# Patient Record
Sex: Female | Born: 1935 | Hispanic: No | Marital: Single | Smoking: Former smoker
Health system: Southern US, Community
[De-identification: ages and names within clinical notes are randomized; demographics above are authoritative.]

## PROBLEM LIST (undated history)

## (undated) DIAGNOSIS — C189 Malignant neoplasm of colon, unspecified: Secondary | ICD-10-CM

## (undated) DIAGNOSIS — IMO0001 Reserved for inherently not codable concepts without codable children: Secondary | ICD-10-CM

## (undated) DIAGNOSIS — I10 Essential (primary) hypertension: Secondary | ICD-10-CM

## (undated) DIAGNOSIS — C801 Malignant (primary) neoplasm, unspecified: Secondary | ICD-10-CM

## (undated) HISTORY — PX: COLON SURGERY: SHX602

## (undated) HISTORY — PX: ABDOMINAL SURGERY: SHX537

---

## 2014-12-20 ENCOUNTER — Emergency Department (HOSPITAL_COMMUNITY): Payer: Self-pay

## 2014-12-20 ENCOUNTER — Encounter (HOSPITAL_COMMUNITY): Payer: Self-pay | Admitting: Emergency Medicine

## 2014-12-20 ENCOUNTER — Inpatient Hospital Stay (HOSPITAL_COMMUNITY)
Admission: EM | Admit: 2014-12-20 | Discharge: 2014-12-22 | DRG: 193 | Disposition: A | Discharge status: Home / Self Care | Payer: Self-pay | Attending: Internal Medicine | Admitting: Internal Medicine

## 2014-12-20 DIAGNOSIS — I10 Essential (primary) hypertension: Secondary | ICD-10-CM | POA: Diagnosis present

## 2014-12-20 DIAGNOSIS — R1084 Generalized abdominal pain: Secondary | ICD-10-CM

## 2014-12-20 DIAGNOSIS — R0902 Hypoxemia: Secondary | ICD-10-CM

## 2014-12-20 DIAGNOSIS — Z79899 Other long term (current) drug therapy: Secondary | ICD-10-CM

## 2014-12-20 DIAGNOSIS — D509 Iron deficiency anemia, unspecified: Secondary | ICD-10-CM | POA: Diagnosis present

## 2014-12-20 DIAGNOSIS — C787 Secondary malignant neoplasm of liver and intrahepatic bile duct: Secondary | ICD-10-CM | POA: Diagnosis present

## 2014-12-20 DIAGNOSIS — J9601 Acute respiratory failure with hypoxia: Secondary | ICD-10-CM | POA: Diagnosis present

## 2014-12-20 DIAGNOSIS — R319 Hematuria, unspecified: Secondary | ICD-10-CM | POA: Diagnosis present

## 2014-12-20 DIAGNOSIS — J181 Lobar pneumonia, unspecified organism: Principal | ICD-10-CM | POA: Diagnosis present

## 2014-12-20 DIAGNOSIS — C801 Malignant (primary) neoplasm, unspecified: Secondary | ICD-10-CM

## 2014-12-20 DIAGNOSIS — C189 Malignant neoplasm of colon, unspecified: Secondary | ICD-10-CM | POA: Diagnosis present

## 2014-12-20 DIAGNOSIS — Z9981 Dependence on supplemental oxygen: Secondary | ICD-10-CM

## 2014-12-20 DIAGNOSIS — R109 Unspecified abdominal pain: Secondary | ICD-10-CM | POA: Diagnosis present

## 2014-12-20 DIAGNOSIS — D638 Anemia in other chronic diseases classified elsewhere: Secondary | ICD-10-CM | POA: Diagnosis present

## 2014-12-20 HISTORY — DX: Reserved for inherently not codable concepts without codable children: IMO0001

## 2014-12-20 HISTORY — DX: Malignant (primary) neoplasm, unspecified: C80.1

## 2014-12-20 HISTORY — DX: Essential (primary) hypertension: I10

## 2014-12-20 LAB — COMPREHENSIVE METABOLIC PANEL
ALBUMIN: 3.9 g/dL (ref 3.5–5.0)
ALK PHOS: 81 U/L (ref 38–126)
ALT: 28 U/L (ref 14–54)
ANION GAP: 6 (ref 5–15)
AST: 31 U/L (ref 15–41)
BUN: 23 mg/dL — ABNORMAL HIGH (ref 6–20)
CO2: 31 mmol/L (ref 22–32)
Calcium: 9.4 mg/dL (ref 8.9–10.3)
Chloride: 103 mmol/L (ref 101–111)
Creatinine, Ser: 0.41 mg/dL — ABNORMAL LOW (ref 0.44–1.00)
GFR calc Af Amer: >60 mL/min (ref >60)
GFR calc non Af Amer: >60 mL/min (ref >60)
GLUCOSE: 108 mg/dL — AB (ref 65–99)
POTASSIUM: 3.7 mmol/L (ref 3.5–5.1)
SODIUM: 140 mmol/L (ref 135–145)
Total Bilirubin: 0.6 mg/dL (ref 0.3–1.2)
Total Protein: 8.2 g/dL — ABNORMAL HIGH (ref 6.5–8.1)

## 2014-12-20 LAB — CBC WITH DIFFERENTIAL/PLATELET
BASOS ABS: 0 10*3/uL (ref 0.0–0.1)
BASOS PCT: 0 %
EOS ABS: 0.1 10*3/uL (ref 0.0–0.7)
Eosinophils Relative: 2 %
HCT: 49.5 % — ABNORMAL HIGH (ref 36.0–46.0)
HEMOGLOBIN: 16 g/dL — AB (ref 12.0–15.0)
Lymphocytes Relative: 19 %
Lymphs Abs: 1.5 10*3/uL (ref 0.7–4.0)
MCH: 27.3 pg (ref 26.0–34.0)
MCHC: 32.3 g/dL (ref 30.0–36.0)
MCV: 84.5 fL (ref 78.0–100.0)
MONOS PCT: 10 %
Monocytes Absolute: 0.8 10*3/uL (ref 0.1–1.0)
NEUTROS PCT: 69 %
Neutro Abs: 5.3 10*3/uL (ref 1.7–7.7)
Platelets: 327 10*3/uL (ref 150–400)
RBC: 5.86 MIL/uL — ABNORMAL HIGH (ref 3.87–5.11)
RDW: 15.3 % (ref 11.5–15.5)
WBC: 7.7 10*3/uL (ref 4.0–10.5)

## 2014-12-20 LAB — URINALYSIS, ROUTINE W REFLEX MICROSCOPIC
Bilirubin Urine: NEGATIVE
Glucose, UA: NEGATIVE mg/dL
Ketones, ur: NEGATIVE mg/dL
LEUKOCYTES UA: NEGATIVE
NITRITE: NEGATIVE
PH: 5.5 (ref 5.0–8.0)
Protein, ur: 100 mg/dL — AB
SPECIFIC GRAVITY, URINE: 1.015 (ref 1.005–1.030)

## 2014-12-20 LAB — LIPASE, BLOOD: Lipase: 29 U/L (ref 11–51)

## 2014-12-20 LAB — PROTIME-INR
INR: 1.01 (ref 0.00–1.49)
Prothrombin Time: 13.5 seconds (ref 11.6–15.2)

## 2014-12-20 LAB — I-STAT TROPONIN, ED: TROPONIN I, POC: 0.01 ng/mL (ref 0.00–0.08)

## 2014-12-20 LAB — APTT: APTT: 35 s (ref 24–37)

## 2014-12-20 LAB — URINE MICROSCOPIC-ADD ON

## 2014-12-20 LAB — BRAIN NATRIURETIC PEPTIDE: B Natriuretic Peptide: 30.2 pg/mL (ref 0.0–100.0)

## 2014-12-20 LAB — STREP PNEUMONIAE URINARY ANTIGEN: Strep Pneumo Urinary Antigen: NEGATIVE

## 2014-12-20 LAB — I-STAT CG4 LACTIC ACID, ED: Lactic Acid, Venous: 1.28 mmol/L (ref 0.5–2.0)

## 2014-12-20 MED ORDER — INFLUENZA VAC SPLIT QUAD 0.5 ML IM SUSY
0.5000 mL | PREFILLED_SYRINGE | INTRAMUSCULAR | Status: AC
  Administered 2014-12-22: 0.5 mL via INTRAMUSCULAR
  Filled 2014-12-20 (×2): qty 0.5

## 2014-12-20 MED ORDER — ADULT MULTIVITAMIN W/MINERALS CH
1.0000 | ORAL_TABLET | Freq: Every day | ORAL | Status: DC
  Administered 2014-12-20 – 2014-12-22 (×3): 1 via ORAL
  Filled 2014-12-20 (×3): qty 1

## 2014-12-20 MED ORDER — CETYLPYRIDINIUM CHLORIDE 0.05 % MT LIQD: 7.0000 mL | Freq: Two times a day (BID) | OROMUCOSAL | Status: DC

## 2014-12-20 MED ORDER — AMLODIPINE BESYLATE 5 MG PO TABS
5.0000 mg | ORAL_TABLET | Freq: Every day | ORAL | Status: DC
  Administered 2014-12-20 – 2014-12-22 (×3): 5 mg via ORAL
  Filled 2014-12-20 (×4): qty 1

## 2014-12-20 MED ORDER — FERROUS SULFATE 325 (65 FE) MG PO TABS
325.0000 mg | ORAL_TABLET | Freq: Every day | ORAL | Status: DC
  Administered 2014-12-20 – 2014-12-22 (×3): 325 mg via ORAL
  Filled 2014-12-20 (×4): qty 1

## 2014-12-20 MED ORDER — IPRATROPIUM-ALBUTEROL 0.5-2.5 (3) MG/3ML IN SOLN
3.0000 mL | Freq: Three times a day (TID) | RESPIRATORY_TRACT | Status: DC
  Administered 2014-12-21 (×3): 3 mL via RESPIRATORY_TRACT
  Filled 2014-12-20 (×3): qty 3

## 2014-12-20 MED ORDER — HYDRALAZINE HCL 20 MG/ML IJ SOLN
5.0000 mg | Freq: Four times a day (QID) | INTRAMUSCULAR | Status: DC | PRN
  Administered 2014-12-20 – 2014-12-21 (×2): 5 mg via INTRAVENOUS
  Filled 2014-12-20 (×2): qty 1

## 2014-12-20 MED ORDER — IPRATROPIUM-ALBUTEROL 0.5-2.5 (3) MG/3ML IN SOLN
3.0000 mL | Freq: Four times a day (QID) | RESPIRATORY_TRACT | Status: DC
  Administered 2014-12-20: 3 mL via RESPIRATORY_TRACT
  Filled 2014-12-20: qty 3

## 2014-12-20 MED ORDER — CHLORHEXIDINE GLUCONATE 0.12 % MT SOLN
15.0000 mL | Freq: Two times a day (BID) | OROMUCOSAL | Status: DC
  Administered 2014-12-22: 15 mL via OROMUCOSAL
  Filled 2014-12-20 (×2): qty 15

## 2014-12-20 MED ORDER — IOHEXOL 300 MG/ML  SOLN
100.0000 mL | Freq: Once | INTRAMUSCULAR | Status: AC | PRN
Start: 2014-12-20 — End: 2014-12-20
  Administered 2014-12-20: 100 mL via INTRAVENOUS

## 2014-12-20 MED ORDER — METHYLPREDNISOLONE SODIUM SUCC 125 MG IJ SOLR
60.0000 mg | INTRAMUSCULAR | Status: DC
  Administered 2014-12-20: 60 mg via INTRAVENOUS
  Filled 2014-12-20: qty 2

## 2014-12-20 MED ORDER — DEXTROSE 5 % IV SOLN
500.0000 mg | INTRAVENOUS | Status: DC
  Administered 2014-12-21: 500 mg via INTRAVENOUS
  Filled 2014-12-20: qty 500

## 2014-12-20 MED ORDER — DEXTROSE 5 % IV SOLN
1.0000 g | Freq: Once | INTRAVENOUS | Status: AC
  Administered 2014-12-20: 1 g via INTRAVENOUS
  Filled 2014-12-20: qty 10

## 2014-12-20 MED ORDER — IPRATROPIUM-ALBUTEROL 0.5-2.5 (3) MG/3ML IN SOLN
3.0000 mL | RESPIRATORY_TRACT | Status: DC | PRN
Start: 2014-12-20 — End: 2014-12-22

## 2014-12-20 MED ORDER — DEXTROSE 5 % IV SOLN
500.0000 mg | Freq: Once | INTRAVENOUS | Status: AC
  Administered 2014-12-20: 500 mg via INTRAVENOUS
  Filled 2014-12-20: qty 500

## 2014-12-20 MED ORDER — DEXTROSE 5 % IV SOLN
1.0000 g | INTRAVENOUS | Status: DC
  Administered 2014-12-21: 1 g via INTRAVENOUS
  Filled 2014-12-20 (×2): qty 10

## 2014-12-20 MED ORDER — PNEUMOCOCCAL VAC POLYVALENT 25 MCG/0.5ML IJ INJ
0.5000 mL | INJECTION | INTRAMUSCULAR | Status: AC
  Administered 2014-12-22: 0.5 mL via INTRAMUSCULAR
  Filled 2014-12-20 (×2): qty 0.5

## 2014-12-20 MED ORDER — OXYCODONE-ACETAMINOPHEN 5-325 MG PO TABS
1.0000 | ORAL_TABLET | ORAL | Status: DC | PRN
  Administered 2014-12-21 – 2014-12-22 (×2): 2 via ORAL
  Filled 2014-12-20 (×2): qty 2

## 2014-12-20 MED ORDER — ACETAMINOPHEN 500 MG PO TABS: 500.0000 mg | ORAL_TABLET | Freq: Four times a day (QID) | ORAL | Status: DC | PRN

## 2014-12-20 NOTE — ED Notes (Addendum)
Pt is currently being moved to [facility] [employer]

## 2014-12-20 NOTE — ED Provider Notes (Signed)
CSN: [id number]     Arrival date & time 12/20/14  0944 History   First MD Initiated Contact with Patient 12/20/14 1004     Chief Complaint  Patient presents with  . Hematuria  . Abdominal Pain     (Consider location/radiation/quality/duration/timing/severity/associated sxs/prior Treatment) Patient is a 81 y.o. female presenting with abdominal pain. The history is provided by a relative. The history is limited by a language barrier.  Abdominal Pain Pain location:  Suprapubic Pain quality: aching   Pain radiates to:  Does not radiate Pain severity:  Moderate Onset quality:  Gradual Duration:  2 days Timing:  Constant Progression:  Unchanged Chronicity:  New Context: previous surgery   Context: not alcohol use and not trauma   Relieved by:  Nothing Worsened by:  Nothing tried Ineffective treatments:  None tried Associated symptoms: hematuria and shortness of breath (with exertion)   Associated symptoms: no chest pain, no cough, no fever and no vomiting   Risk factors: being elderly and obesity     Past Medical History  Diagnosis Date  . Cancer [facility])     stomach   Past Surgical History  Procedure Laterality Date  . Abdominal surgery     No family history on file. Social History  Substance Use Topics  . Smoking status: Never Smoker   . Smokeless tobacco: None  . Alcohol Use: No   OB History    No data available     Review of Systems  Constitutional: Negative for fever.  Respiratory: Positive for shortness of breath (with exertion). Negative for cough.   Cardiovascular: Negative for chest pain.  Gastrointestinal: Positive for abdominal pain. Negative for vomiting.  Genitourinary: Positive for hematuria.  All other systems reviewed and are negative.     Allergies  Review of patient's allergies indicates no known allergies.  Home Medications   Prior to Admission medications   Medication Sig Start Date End Date Taking? Authorizing Provider  acetaminophen  (TYLENOL) 500 MG tablet Take 500 mg by mouth every 6 (six) hours as needed for mild pain.   Yes Historical Provider, MD  amLODipine (NORVASC) 5 MG tablet Take 5 mg by mouth daily.   Yes Historical Provider, MD  ferrous sulfate 325 (65 FE) MG EC tablet Take 325 mg by mouth daily.   Yes Historical Provider, MD  Multiple Vitamin (MULTIVITAMIN WITH MINERALS) TABS tablet Take 1 tablet by mouth daily.   Yes Historical Provider, MD  oxyCODONE-acetaminophen (PERCOCET/ROXICET) 5-325 MG tablet Take 1-5 tablets by mouth every 4 (four) hours as needed for severe pain.   Yes Historical Provider, MD   BP 164/75 mmHg  Pulse 57  Temp(Src) 97.9 F (36.6 C) (Oral)  Resp 18  Wt 153 lb 8 oz (69.627 kg)  SpO2 98% Physical Exam  Constitutional: She is oriented to person, place, and time. She appears well-developed and well-nourished. No distress.  HENT:  Head: Normocephalic.  Eyes: Conjunctivae are normal.  Neck: Neck supple. No tracheal deviation present.  Cardiovascular: Normal rate, regular rhythm and normal heart sounds.   No murmur heard. Pulmonary/Chest: Effort normal. No respiratory distress. She has no wheezes. She has rales (bilaterally). She exhibits no tenderness.  Abdominal: Soft. She exhibits no distension. There is tenderness (mild lower abdomen). There is no rebound and no guarding.  Neurological: She is alert and oriented to person, place, and time.  Skin: Skin is warm and dry.  Psychiatric: She has a normal mood and affect.  Vitals reviewed.   ED Course  Procedures (including critical care time)  Emergency Focused Ultrasound Exam Limited Ultrasound of the Heart and Pericardium  Performed and interpreted by Dr. Laneta Simmers Indication: dyspnea Multiple views of the heart, pericardium, and IVC are obtained with a multi frequency probe.  Findings: nml contractility, no anechoic fluid, minimal IVC collapse Interpretation: nml ejection fraction, no pericardial effusion, no depressed CVP Images  archived electronically.  CPT Code: [id number]   Labs Review Labs Reviewed  CBC WITH DIFFERENTIAL/PLATELET - Abnormal; Notable for the following:    RBC 5.86 (*)    Hemoglobin 16.0 (*)    HCT 49.5 (*)    All other components within normal limits  COMPREHENSIVE METABOLIC PANEL - Abnormal; Notable for the following:    Glucose, Bld 108 (*)    BUN 23 (*)    Creatinine, Ser 0.41 (*)    Total Protein 8.2 (*)    All other components within normal limits  URINALYSIS, ROUTINE W REFLEX MICROSCOPIC (NOT AT [facility]) - Abnormal; Notable for the following:    APPearance CLOUDY (*)    Hgb urine dipstick SMALL (*)    Protein, ur 100 (*)    All other components within normal limits  URINE MICROSCOPIC-ADD ON - Abnormal; Notable for the following:    Squamous Epithelial / LPF 0-5 (*)    Bacteria, UA MANY (*)    All other components within normal limits  URINE CULTURE  CULTURE, BLOOD (ROUTINE X 2)  CULTURE, BLOOD (ROUTINE X 2)  LIPASE, BLOOD  PROTIME-INR  APTT  BRAIN NATRIURETIC PEPTIDE  I-STAT CG4 LACTIC ACID, ED  I-STAT TROPOININ, ED    Imaging Review Ct Abdomen Pelvis W Contrast  12/20/2014  CLINICAL DATA:  Hematuria and abdominal pain. History of gastric cancer. EXAM: CT ABDOMEN AND PELVIS WITH CONTRAST TECHNIQUE: Multidetector CT imaging of the abdomen and pelvis was performed using the standard protocol following bolus administration of intravenous contrast. CONTRAST:  198mL OMNIPAQUE IOHEXOL 300 MG/ML  SOLN COMPARISON:  None. FINDINGS: Lower chest: Motion degradation throughout. Bibasilar atelectasis. Mild cardiomegaly, without pericardial or pleural effusion. Hepatobiliary: Suspect mild hepatic steatosis. subcapsular right hepatic lobe hypo attenuating lesion measures 4.4 x 2.1 cm on image 20/series 2. Normal gallbladder.  Common duct normal for age, 7 mm. Pancreas: Normal for age, without duct dilatation or mass. Spleen: Normal in size, without focal abnormality. Adrenals/Urinary Tract: Normal  adrenal glands. Too small to characterize left renal lesion. Normal right kidney. Suspect a left renal calculus on coronal image 72/series 5. No hydronephrosis. Normal urinary bladder. Stomach/Bowel: No dominant gastric mass. Right hemicolectomy with ileocolic anastomosis. Normal small bowel. Vascular/Lymphatic: Aortic and branch vessel atherosclerosis. No abdominopelvic adenopathy. Reproductive: Dystrophic calcifications within the uterus. No adnexal mass. Other: No significant free fluid. No evidence of omental or peritoneal disease. Musculoskeletal: Mild osteopenia. Suspect remote right inferior pubic ramus trauma. Mild superior endplate compression deformity at L1. IMPRESSION: 1. Mild motion degradation throughout. 2. Suspicion of left nephrolithiasis. Suboptimally evaluated secondary to nondedicated hematuria technique. No obstructive uropathy. 3. Low-density right hepatic lobe subcapsular lesion is indeterminate. Relatively well-circumscribed, without definite enhancement. Given the clinical history of primary malignancy, metastatic disease cannot be excluded. In addition, treated metastatic disease (i.e. post ablation) could have this appearance. Correlation with prior imaging and clinical history suggested. If no prior imaging or clinical history available, tissue sampling may be necessary. MRI may be informative but could be challenging in this 81 year old patient. 4. No dominant gastric mass identified, despite the clinical history. Status post right hemicolectomy. Electronically Signed  By: Abigail Miyamoto M.D.   On: 12/20/2014 13:18   Dg Chest Port 1 View  12/20/2014  CLINICAL DATA:  81 year old female with hypoxia and shortness of breath. EXAM: PORTABLE CHEST 1 VIEW COMPARISON:  None. FINDINGS: Cardiomegaly noted. Mild bilateral airspace opacities are identified. There is no evidence of pleural effusion or pneumothorax. No acute bony abnormalities are identified. IMPRESSION: Bilateral airspace  opacities which may represent pulmonary edema versus infection/pneumonia. Cardiomegaly. Electronically Signed   By: Margarette Canada M.D.   On: 12/20/2014 14:45   I have personally reviewed and evaluated these images and lab results as part of my medical decision-making.   EKG Interpretation None      MDM   Final diagnoses:  Hypoxemia    81 year old female with significant past medical history of previous intra-abdominal cancer presents with lower abdominal pain that has been ongoing associated with small volume hematuria. On arrival she is hypoxemic into the 80s while at rest but is not acutely dyspneic. Family member at bedside provides most of the history and patient is difficult historian due to language barrier. Patient also complaining of some dysuria, urine is questionable for possible infection but no large blood, CT scan of the abdomen and pelvis shows no acute abnormalities. Patient is unable to wean from oxygen and chest x-ray showing infiltrates concerning for pneumonia versus pulmonary edema and given her clinical symptoms I suspect pulmonary edema is more likely. No effusion or severely depressed ejection fraction on bedside echo. Discussed with hospitalist who recommended admission and antibiotics for community acquired pneumonia coverage and will see the patient in the emergency department prior to admission.   Leo Grosser, MD 12/20/14 [id number]

## 2014-12-20 NOTE — H&P (Signed)
[facility] History and Physical  Leonna Caird [id number] DOB: [birth year] DOA: 12/20/2014  Referring physician: ER physician: Dr. Leo Grosser  PCP: No primary care provider on file. we will set up with [facility] on discharge   Chief Complaint: abdominal pain  HPI:  81 year old female (speaks [place]), no family at the bedside to give details of medical history and pt cant speak English so limited history based on EPIC chart review. Apparently she had abdominal pain for past 2 days prior to this admission mostly in suprapubic area with blood  In urine as reported earlier today by her granddaughter. Pt had no reports of nausea or vomiting. No shortness of breath, no chest pain, no palpitations. No diarrhea or constipation. No blood in stool or urine. No lightheadedness or loss of consciousness.   In ED, pt was hemodynamically stable but was hypoxemic with O 2 sats of 85% on room air. This has improved with [place] oxygen support. Blood work showed Hgb of 16 otherwise unremarkable. CXR showed bilateral airspace opacities which may represent pulmonary edema versus infection/pneumonia. She was started on azithromycin and rocephin.    Assessment & Plan    Principal Problem:   Acute respiratory failure with hypoxia (HCC) / Lobar pneumonia, unspecified organism ([place]) - CXR with findings of possible pneumonia versus pulmonary edema - Would treat empirically for pneumonia with azithromycin and rocephin but stop abx if clinically stable  - Pneumonia order set placed on admission - Follow up blood cultures, resp culture, legionella, strep penumonia - Because of wheezing will start solumderol once a day regimen 60 mg IV - Start duoneb scheduled and as needed for shortness of breath or wheezing  - Oxygen support via [place] to keep O2 sat above 90%  Active Problems:   Abdominal pain / Hematuria - Unclear etiology - Suspicion for liver malignancy based on CT scan, left nephrolithiasis but no  obstructive uropathy - Order placed for MRI abdomen - On UA no RBC    Essential hypertension - Continue Norvasc 5 mg daily    IDA (iron deficiency anemia) - Continue ferrous sulfate supplementation   DVT prophylaxis:  - SCD's bilaterally   Radiological Exams on Admission: Ct Abdomen Pelvis W Contrast 12/20/2014  1. Mild motion degradation throughout. 2. Suspicion of left nephrolithiasis. Suboptimally evaluated secondary to nondedicated hematuria technique. No obstructive uropathy. 3. Low-density right hepatic lobe subcapsular lesion is indeterminate. Relatively well-circumscribed, without definite enhancement. Given the clinical history of primary malignancy, metastatic disease cannot be excluded. In addition, treated metastatic disease (i.e. post ablation) could have this appearance.   Dg Chest Port 1 View 12/20/2014  Bilateral airspace opacities which may represent pulmonary edema versus infection/pneumonia. Cardiomegaly. Electronically Signed   By: Margarette Canada M.D.   On: 12/20/2014 14:45    Code Status: Full Family Communication: Plan of care discussed with the patient  Disposition Plan: Admit for further evaluation, telemetry   Leisa Lenz, MD  [facility] Pager [phone]  Time spent in minutes: 55 minutes  Review of Systems:  Constitutional: Negative for fever, chills and malaise/fatigue. Negative for diaphoresis.  HENT: Negative for hearing loss, ear pain, nosebleeds, congestion, sore throat, neck pain, tinnitus and ear discharge.   Eyes: Negative for blurred vision, double vision, photophobia, pain, discharge and redness.  Respiratory: Negative for cough, hemoptysis, sputum production, shortness of breath, wheezing and stridor.   Cardiovascular: Negative for chest pain, palpitations, orthopnea, claudication and leg swelling.  Gastrointestinal: Negative for nausea, vomiting. Negative for heartburn, constipation, blood  in stool and melena.  Genitourinary: Negative for  dysuria, urgency, frequency.  Musculoskeletal: Negative for myalgias, back pain, joint pain and falls.  Skin: Negative for itching and rash.  Neurological: Negative for dizziness and weakness. Negative for tingling, tremors, sensory change, speech change, focal weakness, loss of consciousness and headaches.  Endo/Heme/Allergies: Negative for environmental allergies and polydipsia. Does not bruise/bleed easily.  Psychiatric/Behavioral: Negative for suicidal ideas. The patient is not nervous/anxious.      Past Medical History  Diagnosis Date  . Cancer [facility])     stomach   Past Surgical History  Procedure Laterality Date  . Abdominal surgery     Social History:  reports that she has never smoked. She does not have any smokeless tobacco history on file. She reports that she does not drink alcohol. Her drug history is not on file.  No Known Allergies  Family History: hypertension in family    Prior to Admission medications   Medication Sig Start Date End Date Taking? Authorizing Provider  acetaminophen (TYLENOL) 500 MG tablet Take 500 mg by mouth every 6 (six) hours as needed for mild pain.   Yes Historical Provider, MD  amLODipine (NORVASC) 5 MG tablet Take 5 mg by mouth daily.   Yes Historical Provider, MD  ferrous sulfate 325 (65 FE) MG EC tablet Take 325 mg by mouth daily.   Yes Historical Provider, MD  Multiple Vitamin (MULTIVITAMIN WITH MINERALS) TABS tablet Take 1 tablet by mouth daily.   Yes Historical Provider, MD  oxyCODONE-acetaminophen (PERCOCET/ROXICET) 5-325 MG tablet Take 1-5 tablets by mouth every 4 (four) hours as needed for severe pain.   Yes Historical Provider, MD   Physical Exam: Filed Vitals:   12/20/14 1201 12/20/14 1230 12/20/14 1330 12/20/14 1435  BP: 166/84 150/73 153/82 164/75  Pulse: 58 55 52 57  Temp:      TempSrc:      Resp: 16 17 16 18   Weight:      SpO2: 95% 97% 94% 98%    Physical Exam  Constitutional: Appears well-developed and well-nourished.  No distress.  HENT: Normocephalic. No tonsillar erythema or exudates Eyes: Conjunctivae are normal. No scleral icterus.  Neck: Normal ROM. Neck supple. No JVD. No tracheal deviation. No thyromegaly.  CVS: RRR, S1/S2 +, no murmurs, no gallops, no carotid bruit.  Pulmonary: moderate wheezing in upper and mid lung lobes. No rhonchi. Abdominal: Soft. BS +,  no distension, tenderness, rebound or guarding.  Musculoskeletal: Normal range of motion. No edema and no tenderness.  Lymphadenopathy: No lymphadenopathy noted, cervical, inguinal. Neuro: Alert. Normal reflexes, muscle tone coordination. No focal neurologic deficits. Skin: Skin is warm and dry. No rash noted.  No erythema. No pallor.  Psychiatric: Normal mood and affect. Behavior, judgment, thought content normal.   Labs on Admission:  Basic Metabolic Panel:  Recent Labs Lab 12/20/14 1021  NA 140  K 3.7  CL 103  CO2 31  GLUCOSE 108*  BUN 23*  CREATININE 0.41*  CALCIUM 9.4   Liver Function Tests:  Recent Labs Lab 12/20/14 1021  AST 31  ALT 28  ALKPHOS 81  BILITOT 0.6  PROT 8.2*  ALBUMIN 3.9    Recent Labs Lab 12/20/14 1021  LIPASE 29   No results for input(s): AMMONIA in the last 168 hours. CBC:  Recent Labs Lab 12/20/14 1021  WBC 7.7  NEUTROABS 5.3  HGB 16.0*  HCT 49.5*  MCV 84.5  PLT 327   Cardiac Enzymes: No results for input(s): CKTOTAL, CKMB,  CKMBINDEX, TROPONINI in the last 168 hours. BNP: Invalid input(s): POCBNP CBG: No results for input(s): GLUCAP in the last 168 hours.  If 7PM-7AM, please contact night-coverage [email or web address] Password [facility] 12/20/2014, 4:12 PM

## 2014-12-20 NOTE — ED Notes (Signed)
Pt speaks [place].  Her Grand daughter is bedside.  Her son, Leda Min is in [place] and can be reached at [phone].  Son makes her healthcare decisions but there is no HCPOA paperwork done.  Pts son states that Pt is a FULL CODE.

## 2014-12-20 NOTE — ED Notes (Addendum)
[place] MD made aware of Pt's O2 sat w/o oxygen.

## 2014-12-20 NOTE — ED Notes (Signed)
Pt c/o blood in her urine that started on Saturday per granddaughter, who showed ED staff video of urine on her cell phone in triage.  Pt also c/o abd pain.  Pt has stomach cancer.

## 2014-12-20 NOTE — Progress Notes (Signed)
Pt unable to tell Cm using interpreter phone who her pcp was States she sees a female dr but did not know the pcp name States she came from the dr office today before coming to the ED Pt also states she does not have a pill bottle with name of pcp either Reports she was brought here with children vs going to [place] with other children  [place] daughter was here per ED RN but left to return to work and did not leave a phone number because she stated she could not be called at work  Cm left [place] interpreter on the phone line to assist with pt and ED RN

## 2014-12-20 NOTE — Progress Notes (Signed)
PHARMACY NOTE -  Renal dose adjust antibiotics  Pharmacy has been assisting with renal dosing of antibiotics for CAP. Dosages of Rocephin 1gm IV daily & Zithromax 500mg  IV daily are appropriate and need for further dosage adjustment appears unlikely at present.    Will sign off at this time.  Please reconsult if a change in clinical status warrants re-evaluation of dosage.  Thank you,  Netta Cedars, PharmD, BCPS Pager: [phone] 12/20/2014@5 :40 PM

## 2014-12-20 NOTE — ED Notes (Signed)
Pt was found to have O2 sat 83% RA.  Pt placed on 3L [place] and sat increased to 94%.

## 2014-12-21 LAB — COMPREHENSIVE METABOLIC PANEL
ALBUMIN: 3.5 g/dL (ref 3.5–5.0)
ALT: 27 U/L (ref 14–54)
ANION GAP: 6 (ref 5–15)
AST: 27 U/L (ref 15–41)
Alkaline Phosphatase: 77 U/L (ref 38–126)
BILIRUBIN TOTAL: 0.3 mg/dL (ref 0.3–1.2)
BUN: 28 mg/dL — AB (ref 6–20)
CHLORIDE: 102 mmol/L (ref 101–111)
CO2: 29 mmol/L (ref 22–32)
Calcium: 9.3 mg/dL (ref 8.9–10.3)
Creatinine, Ser: 0.59 mg/dL (ref 0.44–1.00)
GFR calc Af Amer: >60 mL/min (ref >60)
Glucose, Bld: 218 mg/dL — ABNORMAL HIGH (ref 65–99)
POTASSIUM: 3.8 mmol/L (ref 3.5–5.1)
Sodium: 137 mmol/L (ref 135–145)
TOTAL PROTEIN: 7.6 g/dL (ref 6.5–8.1)

## 2014-12-21 LAB — CBC
HEMATOCRIT: 48 % — AB (ref 36.0–46.0)
HEMOGLOBIN: 15.3 g/dL — AB (ref 12.0–15.0)
MCH: 26.7 pg (ref 26.0–34.0)
MCHC: 31.9 g/dL (ref 30.0–36.0)
MCV: 83.9 fL (ref 78.0–100.0)
Platelets: 352 10*3/uL (ref 150–400)
RBC: 5.72 MIL/uL — ABNORMAL HIGH (ref 3.87–5.11)
RDW: 15.4 % (ref 11.5–15.5)
WBC: 7 10*3/uL (ref 4.0–10.5)

## 2014-12-21 LAB — URINE CULTURE: Culture: NO GROWTH

## 2014-12-21 LAB — HIV ANTIBODY (ROUTINE TESTING W REFLEX): HIV SCREEN 4TH GENERATION: NONREACTIVE

## 2014-12-21 MED ORDER — METHYLPREDNISOLONE SODIUM SUCC 125 MG IJ SOLR
60.0000 mg | Freq: Every day | INTRAMUSCULAR | Status: DC
  Administered 2014-12-22: 60 mg via INTRAVENOUS
  Filled 2014-12-21: qty 2

## 2014-12-21 NOTE — Progress Notes (Signed)
Patient ID: Hayley Lewis, female   DOB: 1924-08-23, 81 y.o.   MRN: [id number] [facility] PROGRESS NOTE  Hayley Lewis [id number] DOB: 09-08-1924 DOA: 12/20/2014 PCP: No primary care provider on file. - will set up with [facility] on discharge   Brief narrative:    81 year old female (speaks [place]), no family at the bedside to give details of medical history and pt cant speak English so limited history since admission. Pt with history of cancer (unknown details of this cancer other than knowing from CT scan she has had hemicolectomy). Pt presented wiht abdominal pain for past 2 days prior to this admission mostly in suprapubic area with bloodin urine. No blood in stool. No fevers.   In ED, pt was hemodynamically stable but was hypoxemic with O2 sats of 85% on room air. This has improved with [place] oxygen support. Blood work showed Hgb of 16 otherwise unremarkable. CT abdomen showed suspicion of left nephrolithiasis but no obstructive uropathy. CXR showed bilateral airspace opacities, pulmonary edema versus infection/pneumonia. She was started on azithromycin and rocephin.   Anticipated discharge: Plan for MRI tomorrow after interpretor available. Anticipate discharge by 12/1 or 12/2 if results ok and no further work up needed.   Assessment/Plan:    Principal Problem:  Acute respiratory failure with hypoxia (HCC) / Lobar pneumonia, unspecified organism ([place]) - CXR with findings of possible pneumonia versus pulmonary edema - Because of wheezing on physical exam we started empiric treatment for pneumonia with azithromycin and rocephin - Started low dose once a day solumedrol. Still some wheezing this am so we will continue solumedrol for now.  - Blood cultures, urine culture, HIV so far all negative - Legionella, strep pneumonia - pending  - Continue nebulizer treatments as needed for shortness of breath or wheezing  - Oxygen support via [place] to keep O2 sat above 90%  Active  Problems:  Abdominal pain / Hematuria - Unclear etiology - Suspicion for liver malignancy based on CT scan, left nephrolithiasis but no obstructive uropathy - Hematuria resolved - Did not do MRI today since no interpretor but will be done tomorrow (interpretor found)   Essential hypertension - Continue Norvasc 5 mg daily   IDA (iron deficiency anemia) / Anemia of chronic disease  - Continue ferrous sulfate supplementation   DVT prophylaxis:  - SCD's bilaterally in hospital   Code Status: Full.  Family Communication:  Called son [name] over the phone for update [phone] Disposition Plan: Home 12/1 or 12/2 depending on MRI results and if any work up needed   IV access:  Peripheral IV  Procedures and diagnostic studies:    Ct Abdomen Pelvis W Contrast 12/20/2014 1. Mild motion degradation throughout. 2. Suspicion of left nephrolithiasis. Suboptimally evaluated secondary to nondedicated hematuria technique. No obstructive uropathy. 3. Low-density right hepatic lobe subcapsular lesion is indeterminate. Relatively well-circumscribed, without definite enhancement. Given the clinical history of primary malignancy, metastatic disease cannot be excluded. In addition, treated metastatic disease (i.e. post ablation) could have this appearance.   Dg Chest Port 1 View 12/20/2014 Bilateral airspace opacities which may represent pulmonary edema versus infection/pneumonia. Cardiomegaly. Electronically Signed By: Margarette Canada M.D. On: 12/20/2014 14:45   Medical Consultants:  None   Other Consultants:  None   IAnti-Infectives:   Azithromycin and rocephin 12/20/2014 -->    Leisa Lenz, MD  [facility] Pager [phone]  Time spent in minutes: 25 minutes  If 7PM-7AM, please contact night-coverage [email or web address] Password [email or web address] 12/21/2014, 5:45 PM   LOS: 1 day  HPI/Subjective: No acute overnight events. No further hematuria per RN report.   Objective: Filed Vitals:    12/21/14 0621 12/21/14 0819 12/21/14 0947 12/21/14 1501  BP: 136/72  146/81 130/76  Pulse:    74  Temp:    98 F (36.7 C)  TempSrc:    Oral  Resp:    20  Height:      Weight:      SpO2:  92%  97%   No intake or output data in the 24 hours ending 12/21/14 1745  Exam:   General:  Pt is alert, not in acute distress  Cardiovascular: Regular rate and rhythm, S1/S2 (+)  Respiratory: mild wheezing in upper lung lobes   Abdomen: Soft, non tender, non distended, bowel sounds present  Extremities: pulses DP and PT palpable bilaterally, no cyanosis   Neuro: Grossly nonfocal  Data Reviewed: Basic Metabolic Panel:  Recent Labs Lab 12/20/14 1021 12/21/14 0443  NA 140 137  K 3.7 3.8  CL 103 102  CO2 31 29  GLUCOSE 108* 218*  BUN 23* 28*  CREATININE 0.41* 0.59  CALCIUM 9.4 9.3   Liver Function Tests:  Recent Labs Lab 12/20/14 1021 12/21/14 0443  AST 31 27  ALT 28 27  ALKPHOS 81 77  BILITOT 0.6 0.3  PROT 8.2* 7.6  ALBUMIN 3.9 3.5    Recent Labs Lab 12/20/14 1021  LIPASE 29   No results for input(s): AMMONIA in the last 168 hours. CBC:  Recent Labs Lab 12/20/14 1021 12/21/14 0443  WBC 7.7 7.0  NEUTROABS 5.3  --   HGB 16.0* 15.3*  HCT 49.5* 48.0*  MCV 84.5 83.9  PLT 327 352   Cardiac Enzymes: No results for input(s): CKTOTAL, CKMB, CKMBINDEX, TROPONINI in the last 168 hours. BNP: Invalid input(s): POCBNP CBG: No results for input(s): GLUCAP in the last 168 hours.  Recent Results (from the past 240 hour(s))  Urine culture     Status: None   Collection Time: 12/20/14 11:10 AM  Result Value Ref Range Status   Specimen Description URINE, CATHETERIZED  Final   Special Requests NONE  Final   Culture   Final    NO GROWTH 1 DAY Performed at [facility]    Report Status 12/21/2014 FINAL  Final  Blood culture (routine x 2)     Status: None (Preliminary result)   Collection Time: 12/20/14  3:25 PM  Result Value Ref Range Status   Specimen  Description BLOOD LEFT ANTECUBITAL  Final   Special Requests BOTTLES DRAWN AEROBIC AND ANAEROBIC 5ML  Final   Culture   Final    NO GROWTH < 24 HOURS Performed at [facility]    Report Status PENDING  Incomplete  Blood culture (routine x 2)     Status: None (Preliminary result)   Collection Time: 12/20/14  3:40 PM  Result Value Ref Range Status   Specimen Description BLOOD RIGHT ANTECUBITAL  Final   Special Requests BOTTLES DRAWN AEROBIC AND ANAEROBIC 5ML  Final   Culture   Final    NO GROWTH < 24 HOURS Performed at [facility]    Report Status PENDING  Incomplete     Scheduled Meds: . amLODipine  5 mg Oral Daily  . antiseptic oral rinse  7 mL Mouth Rinse q12n4p  . azithromycin  500 mg Intravenous Q24H  . cefTRIAXone (ROCEPHIN)  IV  1 g Intravenous Q24H  . chlorhexidine  15 mL Mouth Rinse BID  . ferrous  sulfate  325 mg Oral Daily  . Influenza vac split quadrivalent PF  0.5 mL Intramuscular Tomorrow-1000  . ipratropium-albuterol  3 mL Nebulization TID  . methylPREDNISolone (SOLU-MEDROL) injection  60 mg Intravenous Q24H  . multivitamin with minerals  1 tablet Oral Daily  . pneumococcal 23 valent vaccine  0.5 mL Intramuscular Tomorrow-1000   Continuous Infusions:

## 2014-12-21 NOTE — Progress Notes (Signed)
  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  Pt had recent diagnosis of colon cancer in 04/2014. She had liver lesion even then and it was biopsied and showed high grade adenocarcinoma, colon ca primary. She underwent microablation of liver lesion and was scheduled to have repeat CT or MRI to follow up on this and see if any improvement. We will go ahead and proceed with MRI tomorrow so we can see if any progress after microablation.  Leisa Lenz [facility] [id number]

## 2014-12-22 DIAGNOSIS — J181 Lobar pneumonia, unspecified organism: Principal | ICD-10-CM

## 2014-12-22 DIAGNOSIS — C189 Malignant neoplasm of colon, unspecified: Secondary | ICD-10-CM | POA: Diagnosis present

## 2014-12-22 DIAGNOSIS — I10 Essential (primary) hypertension: Secondary | ICD-10-CM

## 2014-12-22 DIAGNOSIS — J9601 Acute respiratory failure with hypoxia: Secondary | ICD-10-CM

## 2014-12-22 DIAGNOSIS — C787 Secondary malignant neoplasm of liver and intrahepatic bile duct: Secondary | ICD-10-CM

## 2014-12-22 DIAGNOSIS — R1030 Lower abdominal pain, unspecified: Secondary | ICD-10-CM

## 2014-12-22 DIAGNOSIS — R319 Hematuria, unspecified: Secondary | ICD-10-CM

## 2014-12-22 LAB — LEGIONELLA PNEUMOPHILA SEROGP 1 UR AG: L. PNEUMOPHILA SEROGP 1 UR AG: NEGATIVE

## 2014-12-22 MED ORDER — LEVOFLOXACIN 750 MG PO TABS: 750.0000 mg | ORAL_TABLET | Freq: Every day | ORAL | Status: AC

## 2014-12-22 MED ORDER — PREDNISONE 50 MG PO TABS: ORAL_TABLET | ORAL | Status: AC

## 2014-12-22 NOTE — Progress Notes (Signed)
SATURATION QUALIFICATIONS: (This note is used to comply with regulatory documentation for home oxygen)  Patient Saturations on Room Air at Rest = 95%  Patient Saturations on Room Air while Ambulating = 86%  Patient Saturations on 2 Liters of oxygen while Ambulating = 95%  Please briefly explain why patient needs home oxygen: Pt desat when she walks

## 2014-12-22 NOTE — Progress Notes (Signed)
Discharge instructions were given to patient son. [facility] delivered oxygen. Pt will be D/C home with family in stable condition.

## 2014-12-22 NOTE — Discharge Summary (Signed)
Physician Discharge Summary  Hayley Lewis [id number] DOB: 1924/07/23 DOA: 12/20/2014  PCP: No primary care provider on file.  Admit date: 12/20/2014 Discharge date: 12/22/2014  Time spent:35 minutes  Recommendations for Outpatient Follow-up:  1. Discharge home with outpatient follow-up with her surgeon at [place](  Dr Remigio Eisenmenger) . As per family she has an appointment for MRI of her abdomen on 12/6. Patient will be discharged on continuous home O2 (2 L via nasal cannula) 2. Patient incomplete total 7 days of antibiotic (Levaquin) on 12/5 3.    Discharge Diagnoses:  Principal Problem:   Acute respiratory failure with hypoxia (HCC)   Active Problems:   Lobar pneumonia, unspecified organism ([place])   Abdominal pain   Essential hypertension   IDA (iron deficiency anemia)   Hematuria   Adenocarcinoma of colon [facility])   Discharge Condition: Fair  Diet recommendation: Regular  Filed Weights   12/20/14 0956 12/20/14 1744  Weight: 69.627 kg (153 lb 8 oz) 69.627 kg (153 lb 8 oz)    History of present illness:  Reason for to admission H&P for details, in brief, 81 year old female (who speaks [place] only) with history of adenocarcinoma of the colon with liver metastases (diagnosed earlier this year and underwent accelerated a laparotomy, ileocolectomy and a small bowel resection in April this year , followed by [facility] of the liver mass on 11/4 seen on biopsy and PET scan), follows at [facility] with Dr. Heather Roberts was brought to the ED with abdominal pain for 2 days duration and some blood in urine. Patient did not have any fevers or chills or any blood in stool. She did not have any nausea or vomiting, chest pain, palpitations complained of shortness of breath. In the ED patient was found to be hypoxic with O2 sat of 85% on room air and improved on nasal cannula. Blood work was unremarkable. Chest x-ray done showed bilateral airspace opacities with pulmonary edema versus  infection. CT of her abdomen showed suspicion of an obstructed left nephrolithiasis and liver mass. Patient admitted to hospital service for possible community acquired pneumonia.  This morning patient's grandson was at bedside who interpreted for the patient.  Hospital Course:  Acute respiratory failure with hypoxia secondary to lobar pneumonia Patient just on empiric Rocephin and azithromycin. She had wheezing on exam and started on Solu-Medrol and as needed nebs. -Blood cultures, urine cultures have been negative. HIV antibody negative. Strep urinary antigen negative. Legionella antigen is pending. Patient remains afebrile and her symptoms have improved. She will be discharged on oral Levaquin to complete a seven-day course of antibiotics. Patient does require continuous home oxygen as she desaturates to 80% on room air and improved to >92% on 2 L via nasal cannula. -I will also discharge her on a short course of oral prednisone (5 days)  Abdominal pain with hematuria No clear etiology. CT scan does show liver lesions but she does have a diagnosis of metastatic adenocarcinoma of the colon with liver metastases. Symptoms have resolved at this time. UA also showing UTI but cultures negative. Will be treated with empiric Levaquin. Patient will follow-up with her surgeon at [facility].   Metastatic adenocarcinoma of the colon with liver metastases Diagnoses early this year and underwent exploratory laparotomy with extended ileocolectomy with en bloc small bowel resection for a near obstructing transverse colon cancer on 4/90/2016. She was then found to have 1.5 cm biopsy-proven metastatic lesion in segment 8 of the liver (also PET positive) area she underwent micro-ablation of the lesion on  11/26/2014. On discussing with her daughter-in-law (who is a hospitalist at [employer] at present), patient has a follow-up appointment with MRI of her abdomen on 12/6. Since her abdominal pain  symptoms have resolved and labs stable she does not need MRI to be done here and can be done as outpatient as scheduled.  Iron deficiency anemia/anemia of chronic disease Continue supplement  Essential hypertension Continue Norvasc  UTI Cultures negative. Covering empirically with Levaquin.  Patient stable to be discharged home with outpatient follow-up.  Diet: Regular  CODE STATUS: Full code family communication: Spoke with daughter-in-law on the phone and grandson at bedside  Disposition: Home with outpatient follow-up  Procedures:  CT abdomen  Consultations:  None  Discharge Exam: Filed Vitals:   12/22/14 1122 12/22/14 1123  BP:    Pulse: 64 60  Temp:    Resp:      General: Elderly female not in distress HEENT: No pallor, moist oral mucosa, supple neck Chest: Clear to auscultation bilaterally CVS: Normal S1 and S2, no murmurs rub or gallop GI: Soft, nondistended, nontender, bowel sounds present Musculoskeletal: Warm, no edema CNS: Alert and awake,   Discharge Instructions    Current Discharge Medication List    START taking these medications   Details  levofloxacin (LEVAQUIN) 750 MG tablet Take 1 tablet (750 mg total) by mouth daily. Qty: 5 tablet, Refills: 0    predniSONE (DELTASONE) 50 MG tablet 1 tablet daily for 5 days Qty: 5 tablet, Refills: 0      CONTINUE these medications which have NOT CHANGED   Details  acetaminophen (TYLENOL) 500 MG tablet Take 500 mg by mouth every 6 (six) hours as needed for mild pain.    amLODipine (NORVASC) 5 MG tablet Take 5 mg by mouth daily.    ferrous sulfate 325 (65 FE) MG EC tablet Take 325 mg by mouth daily.    Multiple Vitamin (MULTIVITAMIN WITH MINERALS) TABS tablet Take 1 tablet by mouth daily.    oxyCODONE-acetaminophen (PERCOCET/ROXICET) 5-325 MG tablet Take 1-5 tablets by mouth every 4 (four) hours as needed for severe pain.       No Known Allergies Follow-up Information    Please follow up.    Why:  surgery / oncology at [facility] in 1 week       The results of significant diagnostics from this hospitalization (including imaging, microbiology, ancillary and laboratory) are listed below for reference.    Significant Diagnostic Studies: Ct Abdomen Pelvis W Contrast  12/20/2014  CLINICAL DATA:  Hematuria and abdominal pain. History of gastric cancer. EXAM: CT ABDOMEN AND PELVIS WITH CONTRAST TECHNIQUE: Multidetector CT imaging of the abdomen and pelvis was performed using the standard protocol following bolus administration of intravenous contrast. CONTRAST:  146mL OMNIPAQUE IOHEXOL 300 MG/ML  SOLN COMPARISON:  None. FINDINGS: Lower chest: Motion degradation throughout. Bibasilar atelectasis. Mild cardiomegaly, without pericardial or pleural effusion. Hepatobiliary: Suspect mild hepatic steatosis. subcapsular right hepatic lobe hypo attenuating lesion measures 4.4 x 2.1 cm on image 20/series 2. Normal gallbladder.  Common duct normal for age, 7 mm. Pancreas: Normal for age, without duct dilatation or mass. Spleen: Normal in size, without focal abnormality. Adrenals/Urinary Tract: Normal adrenal glands. Too small to characterize left renal lesion. Normal right kidney. Suspect a left renal calculus on coronal image 72/series 5. No hydronephrosis. Normal urinary bladder. Stomach/Bowel: No dominant gastric mass. Right hemicolectomy with ileocolic anastomosis. Normal small bowel. Vascular/Lymphatic: Aortic and branch vessel atherosclerosis. No abdominopelvic adenopathy. Reproductive: Dystrophic calcifications within  the uterus. No adnexal mass. Other: No significant free fluid. No evidence of omental or peritoneal disease. Musculoskeletal: Mild osteopenia. Suspect remote right inferior pubic ramus trauma. Mild superior endplate compression deformity at L1. IMPRESSION: 1. Mild motion degradation throughout. 2. Suspicion of left nephrolithiasis. Suboptimally evaluated secondary to nondedicated hematuria  technique. No obstructive uropathy. 3. Low-density right hepatic lobe subcapsular lesion is indeterminate. Relatively well-circumscribed, without definite enhancement. Given the clinical history of primary malignancy, metastatic disease cannot be excluded. In addition, treated metastatic disease (i.e. post ablation) could have this appearance. Correlation with prior imaging and clinical history suggested. If no prior imaging or clinical history available, tissue sampling may be necessary. MRI may be informative but could be challenging in this 81 year old patient. 4. No dominant gastric mass identified, despite the clinical history. Status post right hemicolectomy. Electronically Signed   By: Abigail Miyamoto M.D.   On: 12/20/2014 13:18   Dg Chest Port 1 View  12/20/2014  CLINICAL DATA:  81 year old female with hypoxia and shortness of breath. EXAM: PORTABLE CHEST 1 VIEW COMPARISON:  None. FINDINGS: Cardiomegaly noted. Mild bilateral airspace opacities are identified. There is no evidence of pleural effusion or pneumothorax. No acute bony abnormalities are identified. IMPRESSION: Bilateral airspace opacities which may represent pulmonary edema versus infection/pneumonia. Cardiomegaly. Electronically Signed   By: Margarette Canada M.D.   On: 12/20/2014 14:45    Microbiology: Recent Results (from the past 240 hour(s))  Urine culture     Status: None   Collection Time: 12/20/14 11:10 AM  Result Value Ref Range Status   Specimen Description URINE, CATHETERIZED  Final   Special Requests NONE  Final   Culture   Final    NO GROWTH 1 DAY Performed at [facility]    Report Status 12/21/2014 FINAL  Final  Blood culture (routine x 2)     Status: None (Preliminary result)   Collection Time: 12/20/14  3:25 PM  Result Value Ref Range Status   Specimen Description BLOOD LEFT ANTECUBITAL  Final   Special Requests BOTTLES DRAWN AEROBIC AND ANAEROBIC 5ML  Final   Culture   Final    NO GROWTH < 24  HOURS Performed at [facility]    Report Status PENDING  Incomplete  Blood culture (routine x 2)     Status: None (Preliminary result)   Collection Time: 12/20/14  3:40 PM  Result Value Ref Range Status   Specimen Description BLOOD RIGHT ANTECUBITAL  Final   Special Requests BOTTLES DRAWN AEROBIC AND ANAEROBIC 5ML  Final   Culture   Final    NO GROWTH < 24 HOURS Performed at [facility]    Report Status PENDING  Incomplete     Labs: Basic Metabolic Panel:  Recent Labs Lab 12/20/14 1021 12/21/14 0443  NA 140 137  K 3.7 3.8  CL 103 102  CO2 31 29  GLUCOSE 108* 218*  BUN 23* 28*  CREATININE 0.41* 0.59  CALCIUM 9.4 9.3   Liver Function Tests:  Recent Labs Lab 12/20/14 1021 12/21/14 0443  AST 31 27  ALT 28 27  ALKPHOS 81 77  BILITOT 0.6 0.3  PROT 8.2* 7.6  ALBUMIN 3.9 3.5    Recent Labs Lab 12/20/14 1021  LIPASE 29   No results for input(s): AMMONIA in the last 168 hours. CBC:  Recent Labs Lab 12/20/14 1021 12/21/14 0443  WBC 7.7 7.0  NEUTROABS 5.3  --   HGB 16.0* 15.3*  HCT 49.5* 48.0*  MCV 84.5  83.9  PLT 327 352   Cardiac Enzymes: No results for input(s): CKTOTAL, CKMB, CKMBINDEX, TROPONINI in the last 168 hours. BNP: BNP (last 3 results)  Recent Labs  12/20/14 1532  BNP 30.2    ProBNP (last 3 results) No results for input(s): PROBNP in the last 8760 hours.  CBG: No results for input(s): GLUCAP in the last 168 hours.     SignedLouellen Molder  [facility] 12/22/2014, 12:17 PM

## 2014-12-22 NOTE — Progress Notes (Signed)
MD order for home 02. Desaturation screen done by nursing and [facility] DME rep contacted for referral. No other DC needs communicated. Marney Doctor RN,BSN,NCM [phone]

## 2014-12-25 LAB — CULTURE, BLOOD (ROUTINE X 2)
CULTURE: NO GROWTH
Culture: NO GROWTH

## 2015-05-11 ENCOUNTER — Inpatient Hospital Stay (HOSPITAL_COMMUNITY)
Admission: EM | Admit: 2015-05-11 | Discharge: 2015-06-23 | DRG: 871 | Disposition: E | Payer: Medicaid Other | Attending: Internal Medicine | Admitting: Internal Medicine

## 2015-05-11 ENCOUNTER — Emergency Department (HOSPITAL_COMMUNITY): Payer: Medicaid Other

## 2015-05-11 ENCOUNTER — Encounter (HOSPITAL_COMMUNITY): Payer: Self-pay

## 2015-05-11 DIAGNOSIS — J9601 Acute respiratory failure with hypoxia: Secondary | ICD-10-CM | POA: Diagnosis present

## 2015-05-11 DIAGNOSIS — I5033 Acute on chronic diastolic (congestive) heart failure: Secondary | ICD-10-CM | POA: Diagnosis present

## 2015-05-11 DIAGNOSIS — J9620 Acute and chronic respiratory failure, unspecified whether with hypoxia or hypercapnia: Secondary | ICD-10-CM | POA: Insufficient documentation

## 2015-05-11 DIAGNOSIS — J189 Pneumonia, unspecified organism: Secondary | ICD-10-CM | POA: Diagnosis present

## 2015-05-11 DIAGNOSIS — J9621 Acute and chronic respiratory failure with hypoxia: Secondary | ICD-10-CM | POA: Diagnosis present

## 2015-05-11 DIAGNOSIS — Z7189 Other specified counseling: Secondary | ICD-10-CM | POA: Insufficient documentation

## 2015-05-11 DIAGNOSIS — E440 Moderate protein-calorie malnutrition: Secondary | ICD-10-CM | POA: Diagnosis present

## 2015-05-11 DIAGNOSIS — I2781 Cor pulmonale (chronic): Secondary | ICD-10-CM | POA: Diagnosis present

## 2015-05-11 DIAGNOSIS — I272 Other secondary pulmonary hypertension: Secondary | ICD-10-CM | POA: Diagnosis present

## 2015-05-11 DIAGNOSIS — C787 Secondary malignant neoplasm of liver and intrahepatic bile duct: Secondary | ICD-10-CM | POA: Insufficient documentation

## 2015-05-11 DIAGNOSIS — R0902 Hypoxemia: Secondary | ICD-10-CM

## 2015-05-11 DIAGNOSIS — B659 Schistosomiasis, unspecified: Secondary | ICD-10-CM | POA: Diagnosis present

## 2015-05-11 DIAGNOSIS — C799 Secondary malignant neoplasm of unspecified site: Secondary | ICD-10-CM | POA: Diagnosis present

## 2015-05-11 DIAGNOSIS — I480 Paroxysmal atrial fibrillation: Secondary | ICD-10-CM | POA: Diagnosis not present

## 2015-05-11 DIAGNOSIS — M79601 Pain in right arm: Secondary | ICD-10-CM

## 2015-05-11 DIAGNOSIS — E1165 Type 2 diabetes mellitus with hyperglycemia: Secondary | ICD-10-CM | POA: Diagnosis present

## 2015-05-11 DIAGNOSIS — Y95 Nosocomial condition: Secondary | ICD-10-CM | POA: Diagnosis present

## 2015-05-11 DIAGNOSIS — E876 Hypokalemia: Secondary | ICD-10-CM | POA: Diagnosis not present

## 2015-05-11 DIAGNOSIS — J9622 Acute and chronic respiratory failure with hypercapnia: Secondary | ICD-10-CM | POA: Diagnosis present

## 2015-05-11 DIAGNOSIS — I110 Hypertensive heart disease with heart failure: Secondary | ICD-10-CM | POA: Diagnosis present

## 2015-05-11 DIAGNOSIS — Z515 Encounter for palliative care: Secondary | ICD-10-CM | POA: Insufficient documentation

## 2015-05-11 DIAGNOSIS — Z66 Do not resuscitate: Secondary | ICD-10-CM | POA: Diagnosis not present

## 2015-05-11 DIAGNOSIS — R64 Cachexia: Secondary | ICD-10-CM | POA: Diagnosis present

## 2015-05-11 DIAGNOSIS — Z6832 Body mass index (BMI) 32.0-32.9, adult: Secondary | ICD-10-CM | POA: Diagnosis not present

## 2015-05-11 DIAGNOSIS — C189 Malignant neoplasm of colon, unspecified: Secondary | ICD-10-CM | POA: Diagnosis present

## 2015-05-11 DIAGNOSIS — J811 Chronic pulmonary edema: Secondary | ICD-10-CM | POA: Diagnosis present

## 2015-05-11 DIAGNOSIS — E669 Obesity, unspecified: Secondary | ICD-10-CM | POA: Diagnosis present

## 2015-05-11 DIAGNOSIS — A419 Sepsis, unspecified organism: Principal | ICD-10-CM | POA: Diagnosis present

## 2015-05-11 DIAGNOSIS — E872 Acidosis: Secondary | ICD-10-CM | POA: Diagnosis present

## 2015-05-11 DIAGNOSIS — R0602 Shortness of breath: Secondary | ICD-10-CM | POA: Insufficient documentation

## 2015-05-11 DIAGNOSIS — Z9981 Dependence on supplemental oxygen: Secondary | ICD-10-CM | POA: Diagnosis not present

## 2015-05-11 DIAGNOSIS — I10 Essential (primary) hypertension: Secondary | ICD-10-CM | POA: Diagnosis present

## 2015-05-11 DIAGNOSIS — Z87891 Personal history of nicotine dependence: Secondary | ICD-10-CM | POA: Diagnosis not present

## 2015-05-11 DIAGNOSIS — F419 Anxiety disorder, unspecified: Secondary | ICD-10-CM | POA: Diagnosis present

## 2015-05-11 DIAGNOSIS — I2609 Other pulmonary embolism with acute cor pulmonale: Secondary | ICD-10-CM | POA: Insufficient documentation

## 2015-05-11 DIAGNOSIS — Z79899 Other long term (current) drug therapy: Secondary | ICD-10-CM

## 2015-05-11 HISTORY — DX: Malignant neoplasm of colon, unspecified: C18.9

## 2015-05-11 LAB — COMPREHENSIVE METABOLIC PANEL
ALK PHOS: 96 U/L (ref 38–126)
ALT: 32 U/L (ref 14–54)
AST: 43 U/L — AB (ref 15–41)
Albumin: 3.7 g/dL (ref 3.5–5.0)
Anion gap: 12 (ref 5–15)
BILIRUBIN TOTAL: 0.7 mg/dL (ref 0.3–1.2)
BUN: 17 mg/dL (ref 6–20)
CALCIUM: 9 mg/dL (ref 8.9–10.3)
CO2: 27 mmol/L (ref 22–32)
CREATININE: 0.57 mg/dL (ref 0.44–1.00)
Chloride: 98 mmol/L — ABNORMAL LOW (ref 101–111)
GFR calc Af Amer: >60 mL/min (ref >60)
Glucose, Bld: 249 mg/dL — ABNORMAL HIGH (ref 65–99)
POTASSIUM: 4.6 mmol/L (ref 3.5–5.1)
Sodium: 137 mmol/L (ref 135–145)
TOTAL PROTEIN: 8.2 g/dL — AB (ref 6.5–8.1)

## 2015-05-11 LAB — CBC WITH DIFFERENTIAL/PLATELET
BASOS ABS: 0 10*3/uL (ref 0.0–0.1)
BASOS PCT: 0 %
EOS ABS: 0.1 10*3/uL (ref 0.0–0.7)
EOS PCT: 1 %
HCT: 48.4 % — ABNORMAL HIGH (ref 36.0–46.0)
Hemoglobin: 15.5 g/dL — ABNORMAL HIGH (ref 12.0–15.0)
Lymphocytes Relative: 14 %
Lymphs Abs: 1.8 10*3/uL (ref 0.7–4.0)
MCH: 27.3 pg (ref 26.0–34.0)
MCHC: 32 g/dL (ref 30.0–36.0)
MCV: 85.4 fL (ref 78.0–100.0)
Monocytes Absolute: 0.9 10*3/uL (ref 0.1–1.0)
Monocytes Relative: 7 %
Neutro Abs: 10 10*3/uL — ABNORMAL HIGH (ref 1.7–7.7)
Neutrophils Relative %: 78 %
PLATELETS: 362 10*3/uL (ref 150–400)
RBC: 5.67 MIL/uL — AB (ref 3.87–5.11)
RDW: 14.8 % (ref 11.5–15.5)
WBC: 12.7 10*3/uL — AB (ref 4.0–10.5)

## 2015-05-11 LAB — BLOOD GAS, ARTERIAL
Acid-Base Excess: 3 mmol/L — ABNORMAL HIGH (ref 0.0–2.0)
BICARBONATE: 30.3 meq/L — AB (ref 20.0–24.0)
DELIVERY SYSTEMS: POSITIVE
Drawn by: 422461
Expiratory PAP: 8
FIO2: 1
INSPIRATORY PAP: 18
LHR: 10 {breaths}/min
Mode: POSITIVE
O2 Saturation: 97.6 %
PO2 ART: 126 mmHg — AB (ref 80.0–100.0)
Patient temperature: 101.3
TCO2: 26.9 mmol/L (ref 0–100)
pCO2 arterial: 64.5 mmHg (ref 35.0–45.0)
pH, Arterial: 7.302 — ABNORMAL LOW (ref 7.350–7.450)

## 2015-05-11 LAB — BRAIN NATRIURETIC PEPTIDE: B NATRIURETIC PEPTIDE 5: 53.4 pg/mL (ref 0.0–100.0)

## 2015-05-11 LAB — I-STAT CG4 LACTIC ACID, ED: Lactic Acid, Venous: 2.65 mmol/L (ref 0.5–2.0)

## 2015-05-11 MED ORDER — METHYLPREDNISOLONE SODIUM SUCC 125 MG IJ SOLR
125.0000 mg | Freq: Once | INTRAMUSCULAR | Status: AC
  Administered 2015-05-11: 125 mg via INTRAVENOUS

## 2015-05-11 MED ORDER — ALBUTEROL SULFATE (2.5 MG/3ML) 0.083% IN NEBU
2.5000 mg | INHALATION_SOLUTION | Freq: Four times a day (QID) | RESPIRATORY_TRACT | Status: DC | PRN
Start: 2015-05-11 — End: 2015-05-23
  Administered 2015-05-16 – 2015-05-22 (×2): 2.5 mg via RESPIRATORY_TRACT
  Filled 2015-05-11 (×2): qty 3

## 2015-05-11 MED ORDER — CEFEPIME HCL 1 G IJ SOLR
1.0000 g | Freq: Three times a day (TID) | INTRAMUSCULAR | Status: DC
  Filled 2015-05-11: qty 1

## 2015-05-11 MED ORDER — ALBUTEROL (5 MG/ML) CONTINUOUS INHALATION SOLN
10.0000 mg/h | INHALATION_SOLUTION | RESPIRATORY_TRACT | Status: DC
  Administered 2015-05-11: 10 mg/h via RESPIRATORY_TRACT
  Filled 2015-05-11: qty 20

## 2015-05-11 MED ORDER — ACETAMINOPHEN 650 MG RE SUPP
650.0000 mg | Freq: Once | RECTAL | Status: AC
  Administered 2015-05-11: 650 mg via RECTAL
  Filled 2015-05-11: qty 1

## 2015-05-11 MED ORDER — SODIUM CHLORIDE 0.9 % IV BOLUS (SEPSIS)
1000.0000 mL | Freq: Once | INTRAVENOUS | Status: AC
  Administered 2015-05-11: 1000 mL via INTRAVENOUS

## 2015-05-11 MED ORDER — IPRATROPIUM BROMIDE 0.02 % IN SOLN
0.5000 mg | Freq: Four times a day (QID) | RESPIRATORY_TRACT | Status: DC | PRN
  Administered 2015-05-16: 0.5 mg via RESPIRATORY_TRACT
  Filled 2015-05-11: qty 2.5

## 2015-05-11 MED ORDER — ONDANSETRON HCL 4 MG/2ML IJ SOLN: 4.0000 mg | Freq: Four times a day (QID) | INTRAMUSCULAR | Status: DC | PRN

## 2015-05-11 MED ORDER — ENOXAPARIN SODIUM 40 MG/0.4ML ~~LOC~~ SOLN
40.0000 mg | Freq: Every day | SUBCUTANEOUS | Status: DC
  Administered 2015-05-12 – 2015-05-22 (×12): 40 mg via SUBCUTANEOUS
  Filled 2015-05-11 (×12): qty 0.4

## 2015-05-11 MED ORDER — INSULIN ASPART 100 UNIT/ML ~~LOC~~ SOLN
0.0000 [IU] | Freq: Three times a day (TID) | SUBCUTANEOUS | Status: DC
  Administered 2015-05-12: 5 [IU] via SUBCUTANEOUS
  Administered 2015-05-12: 2 [IU] via SUBCUTANEOUS
  Administered 2015-05-12: 5 [IU] via SUBCUTANEOUS
  Administered 2015-05-13 – 2015-05-14 (×2): 1 [IU] via SUBCUTANEOUS
  Administered 2015-05-15: 3 [IU] via SUBCUTANEOUS
  Administered 2015-05-15: 2 [IU] via SUBCUTANEOUS
  Administered 2015-05-16: 1 [IU] via SUBCUTANEOUS
  Administered 2015-05-16 (×2): 2 [IU] via SUBCUTANEOUS
  Administered 2015-05-17: 3 [IU] via SUBCUTANEOUS
  Administered 2015-05-18: 1 [IU] via SUBCUTANEOUS
  Administered 2015-05-18 – 2015-05-19 (×3): 2 [IU] via SUBCUTANEOUS
  Administered 2015-05-19: 1 [IU] via SUBCUTANEOUS
  Administered 2015-05-20: 5 [IU] via SUBCUTANEOUS
  Administered 2015-05-20: 3 [IU] via SUBCUTANEOUS
  Administered 2015-05-20: 5 [IU] via SUBCUTANEOUS
  Administered 2015-05-21: 9 [IU] via SUBCUTANEOUS
  Administered 2015-05-21: 2 [IU] via SUBCUTANEOUS
  Administered 2015-05-22: 3 [IU] via SUBCUTANEOUS
  Administered 2015-05-22: 7 [IU] via SUBCUTANEOUS
  Administered 2015-05-22: 3 [IU] via SUBCUTANEOUS

## 2015-05-11 MED ORDER — ONDANSETRON HCL 4 MG PO TABS: 4.0000 mg | ORAL_TABLET | Freq: Four times a day (QID) | ORAL | Status: DC | PRN

## 2015-05-11 MED ORDER — PIPERACILLIN-TAZOBACTAM 3.375 G IVPB 30 MIN
3.3750 g | Freq: Once | INTRAVENOUS | Status: AC
  Administered 2015-05-11: 3.375 g via INTRAVENOUS
  Filled 2015-05-11: qty 50

## 2015-05-11 MED ORDER — SODIUM CHLORIDE 0.9 % IV SOLN
INTRAVENOUS | Status: AC
  Administered 2015-05-11: via INTRAVENOUS

## 2015-05-11 MED ORDER — VANCOMYCIN HCL IN DEXTROSE 1-5 GM/200ML-% IV SOLN
1000.0000 mg | Freq: Once | INTRAVENOUS | Status: AC
  Administered 2015-05-11: 1000 mg via INTRAVENOUS
  Filled 2015-05-11: qty 200

## 2015-05-11 NOTE — H&P (Signed)
History and Physical    Hayley Lewis [id number] DOB: 02-07-1924 DOA: 04/23/2015  Referring MD/NP/PA: Julianne Rice, MD  PCP: No primary care provider on file. Outpatient Specialists:  Patient coming from: Home.  Chief Complaint: Shortness of breath.  HPI: Hayley Lewis is a 82 y.o. female with medical history significant of hypertension, metastatic colon cancer who was brought to the emergency department due to worsening shortness of breath since the morning. O2 sat initially in the ER was in the 30s. She is on 2 LPM oxygen via nasal cannula at home. The patient is originally from [place] and does not speak english.  ED Course:  Patient was put on nonrebreather mask oxygen and subsequently on BiPAP ventilation sustaining significant relief. Workup shows leukocytosis, lactic acidosis, hyperglycemia and imaging consistent with bilateral pneumonia. She was admitted to the stepdown unit.  Review of Systems:  Unable to review due to the patient's clinical condition, language barrier and need for BiPAP mask. History was provided by her son.  Past Medical History  Diagnosis Date  . Hypertension   . Shortness of breath dyspnea   . Cancer ([place])   . Metastatic colon cancer in female [facility])     Past Surgical History  Procedure Laterality Date  . Abdominal surgery    . Colon surgery       reports that she quit smoking about 8 years ago. She does not have any smokeless tobacco history on file. She reports that she does not drink alcohol or use illicit drugs.  No Known Allergies  No family history on file. Reviewed.  Prior to Admission medications   Medication Sig Start Date End Date Taking? Authorizing Provider  amLODipine (NORVASC) 5 MG tablet Take 5 mg by mouth daily.   Yes Historical Provider, MD  acetaminophen (TYLENOL) 500 MG tablet Take 500 mg by mouth every 6 (six) hours as needed for mild pain.    Historical Provider, MD  ferrous sulfate 325 (65 FE) MG EC tablet  Take 325 mg by mouth daily.    Historical Provider, MD  Multiple Vitamin (MULTIVITAMIN WITH MINERALS) TABS tablet Take 1 tablet by mouth daily.    Historical Provider, MD  oxyCODONE-acetaminophen (PERCOCET/ROXICET) 5-325 MG tablet Take 1-5 tablets by mouth every 4 (four) hours as needed for severe pain.    Historical Provider, MD    Physical Exam: Filed Vitals:   05/14/2015 2100 05/22/2015 2115 05/06/2015 2130 05/20/2015 2145  BP: 130/73 124/76 115/72 107/71  Pulse: 103 100 99 95  Temp:      TempSrc:      Resp: 22 18 21 22   SpO2: 99% 96% 96% 95%      Constitutional: NAD, On BiPAP ventilation. Filed Vitals:   04/29/2015 2100 05/19/2015 2115 04/26/2015 2130 05/10/2015 2145  BP: 130/73 124/76 115/72 107/71  Pulse: 103 100 99 95  Temp:      TempSrc:      Resp: 22 18 21 22   SpO2: 99% 96% 96% 95%   Eyes: PERRL, lids and conjunctivae normal ENMT: BiPAP mask In place. Mucous membranes are dry.   Neck: normal, supple, no masses, no thyromegaly Respiratory: Decreased breath sounds on bases, bibasilar rales and scattered rhonchi bilaterally.                      No accessory muscle use. Cardiovascular: Regular rate and rhythm, no murmurs / rubs / gallops. 1+ lower extremity edema.  2+ pedal pulses. No carotid bruits.  Abdomen: no tenderness, no masses palpated. No hepatosplenomegaly. Bowel sounds positive.  Musculoskeletal: no clubbing / cyanosis. No joint deformity upper and lower extremities. Good ROM, no contractures. Normal muscle tone.  Skin: no rashes, lesions, ulcers. No induration Neurologic: Moves all extremities and responds to touch and rubbing. Unable to fully evaluate. Marland Kitchen Psychiatric: Lethargic.    Labs on Admission: I have personally reviewed following labs and imaging studies  CBC:  Recent Labs Lab 05/17/2015 1944  WBC 12.7*  NEUTROABS 10.0*  HGB 15.5*  HCT 48.4*  MCV 85.4  PLT 123XX123   Basic Metabolic Panel:  Recent Labs Lab 05/20/2015 1944  NA  137  K 4.6  CL 98*  CO2 27  GLUCOSE 249*  BUN 17  CREATININE 0.57  CALCIUM 9.0   Liver Function Tests:  Recent Labs Lab 05/13/2015 1944  AST 43*  ALT 32  ALKPHOS 96  BILITOT 0.7  PROT 8.2*  ALBUMIN 3.7   Urine analysis:    Component Value Date/Time   COLORURINE YELLOW 12/20/2014 1108   APPEARANCEUR CLOUDY* 12/20/2014 1108   LABSPEC 1.015 12/20/2014 1108   PHURINE 5.5 12/20/2014 1108   GLUCOSEU NEGATIVE 12/20/2014 1108   HGBUR SMALL* 12/20/2014 1108   BILIRUBINUR NEGATIVE 12/20/2014 1108   [place] 12/20/2014 1108   PROTEINUR 100* 12/20/2014 1108   NITRITE NEGATIVE 12/20/2014 1108   LEUKOCYTESUR NEGATIVE 12/20/2014 1108    Radiological Exams on Admission: Dg Chest Port 1 View  05/20/2015  CLINICAL DATA:  Difficulty breathing.  Metastatic colon cancer. EXAM: PORTABLE CHEST 1 VIEW COMPARISON:  12/20/2014 FINDINGS: Mild cardiomegaly with bibasilar airspace opacities and mid lung interstitial and faint airspace opacities noted. These have worsened compared to 12/20/2014. There are some Kerley B-lines. Blunting of the left costophrenic angle noted. IMPRESSION: 1. Bibasilar airspace opacities with bilateral interstitial accentuation and faint airspace opacities in the mid lungs. Suspected left pleural effusion. Differential diagnostic considerations include acute pulmonary edema and bilateral pneumonia. Correlate with any fever/leukocytosis. Given the history of malignancy, followup chest radiography to ensure clearance is likely warranted. Electronically Signed   By: Van Clines M.D.   On: 05/21/2015 20:24    EKG: Independently reviewed.  Vent. rate 121 BPM PR interval 170 ms QRS duration 77 ms QT/QTc 305/433 ms P-R-T axes 1 100 -28 Sinus tachycardia Ventricular premature complex Probable left atrial enlargement Right axis deviation Borderline T abnormalities, inferior leads  Assessment/Plan Principal Problem:   HCAP (healthcare-associated pneumonia)    Sepsis due to pneumonia ([place])   Acute respiratory failure with hypoxia ([place]) Admit to a stepdown. Continue supplemental oxygen and BiPAP ventilation. Continue bronchodilators as needed. Continue broad-spectrum antibiotics. Check sputum Gram stain, culture and sensitivity. Follow-up blood cultures. Check Legionella and strep pneumoniae urinary antigen.  Active Problems:   Essential hypertension Hold antihypertensives for now. Monitor blood pressure closely.      Adenocarcinoma of colon [facility]) Follow-up per oncology.        DVT prophylaxis: (Lovenox) Code Status: (Full) Family Communication:  Maeer,Greg Son   [phone]  Disposition Plan:  (Admit for IV antibiotic therapy and BiPAP ventilation)  Consults called: NA Admission status: (inpatient/SDU)   Reubin Milan MD [facility] Pager [phone].  If 7PM-7AM, please contact night-coverage [email or web address] Password [email or web address]  05/19/2015, 10:11 PM

## 2015-05-11 NOTE — Progress Notes (Signed)
EDCM spoke to patient's grandson at bedside. Patient's grandson  confirms patient does not have a pcp or insurance living in [place].  [facility] provided patient with contact infromation to [facility], informed patient of services there.  EDCM also provided patient with list of pcps who accept self pay patients, list of discount pharmacies and websites needymeds.org and GoodRX.com for medication assistance, phone number to inquire about the orange card, phone number to inquire about [employer], phone number to inquire about the [place], financial resources in the community such as local churches, [employer], urban ministries, and dental assistance for uninsured patients.  Patient's grandson thankfulf or resources.  No further EDCM needs at this time.  Patient on BIPAP machine when entered patient's room.  Patient's grandson at bedside reports he can read and speak [place], patient does not speak [place].  Patient's grandson confirms patient does not have insurance or a pcp.

## 2015-05-11 NOTE — ED Notes (Signed)
Attempt to place foley with Ati RN at bedside, patient has excoriation to genitals with slight bleeding prior to foley insert.  Patient gently cleansed, unable to locate meatus due to scar tissue, Anderson Malta RN at bedside and also unable to locate urinary meatus, Dr. Lita Mains made aware

## 2015-05-11 NOTE — ED Notes (Signed)
Report to [facility] RN in ICU

## 2015-05-11 NOTE — Progress Notes (Signed)
Pharmacy Antibiotic Note  Hayley Lewis is a 82 y.o. female admitted on 04/23/2015 with pneumonia.  Pharmacy has been consulted for vancomycin/cefepime dosing.  Plan: Vancomycin 1Gm x1 then 750mg  IV q12h Cefepime 1Gm IV q12h     Temp (24hrs), Avg:101.3 F (38.5 C), Min:101.3 F (38.5 C), Max:101.3 F (38.5 C)   Recent Labs Lab 05/08/2015 1944 05/14/2015 1952  WBC 12.7*  --   CREATININE 0.57  --   LATICACIDVEN  --  2.65*    CrCl cannot be calculated (Unknown ideal weight.).    No Known Allergies  Antimicrobials this admission: 4/19 zosyn >> 4/19 4/19 vancomycin >> 4/20 cefepime >>    Dose adjustments this admission:   Microbiology results:  BCx:   UCx:    Sputum:   MRSA PCR:   Thank you for allowing pharmacy to be a part of this patient's care.  Dorrene German 05/19/2015 11:44 PM

## 2015-05-11 NOTE — Progress Notes (Signed)
Pt transported to ICU from ED without complication.  Vital signs stable throughout trip.

## 2015-05-11 NOTE — ED Notes (Signed)
Per son, patient does not speak [place], patient is from [place], son states patient having difficulty breathing since this AM, brought patient to ED, triage got patient out of car, sat 30's, brought immediately to Res B

## 2015-05-11 NOTE — ED Provider Notes (Signed)
CSN: [id number]     Arrival date & time 04/25/2015  1933 History   First MD Initiated Contact with Patient 05/07/2015 1939     Chief Complaint  Patient presents with  . Respiratory Distress     (Consider location/radiation/quality/duration/timing/severity/associated sxs/prior Treatment) HPI Low 5 caveat applies due to respiratory distress. Patient presents with shortness of breath starting this morning and progressing from a day. Brought in by her grandson. Oxygen saturations in the 30s in triage. Per grandson patient is on 2 L of home O2.  Past Medical History  Diagnosis Date  . Hypertension   . Shortness of breath dyspnea   . Cancer ([place])   . Metastatic colon cancer in female [facility])    Past Surgical History  Procedure Laterality Date  . Abdominal surgery    . Colon surgery     No family history on file. Social History  Substance Use Topics  . Smoking status: Former Smoker    Quit date: 01/23/2007  . Smokeless tobacco: None  . Alcohol Use: No   OB History    No data available     Review of Systems  Unable to perform ROS: Acuity of condition      Allergies  Review of patient's allergies indicates no known allergies.  Home Medications   Prior to Admission medications   Medication Sig Start Date End Date Taking? Authorizing Provider  amLODipine (NORVASC) 5 MG tablet Take 5 mg by mouth daily.   Yes Historical Provider, MD  acetaminophen (TYLENOL) 500 MG tablet Take 500 mg by mouth every 6 (six) hours as needed for mild pain.    Historical Provider, MD  ferrous sulfate 325 (65 FE) MG EC tablet Take 325 mg by mouth daily.    Historical Provider, MD  Multiple Vitamin (MULTIVITAMIN WITH MINERALS) TABS tablet Take 1 tablet by mouth daily.    Historical Provider, MD  oxyCODONE-acetaminophen (PERCOCET/ROXICET) 5-325 MG tablet Take 1-5 tablets by mouth every 4 (four) hours as needed for severe pain.    Historical Provider, MD   BP 124/76 mmHg  Pulse 100  Temp(Src) 101.3 F  (38.5 C) (Rectal)  Resp 18  SpO2 96%  LMP  Physical Exam  Constitutional: She appears well-developed and well-nourished. She appears distressed.  HENT:  Head: Normocephalic and atraumatic.  Mouth/Throat: Oropharynx is clear and moist.  Eyes: EOM are normal. Pupils are equal, round, and reactive to light.  Neck: Normal range of motion. Neck supple. No JVD present.  Cardiovascular: Regular rhythm.   Tachycardia  Pulmonary/Chest: She is in respiratory distress.  Decreased air movement and scattered rhonchi. Tachypnea  Abdominal: Soft. Bowel sounds are normal. She exhibits no distension and no mass. There is no tenderness. There is no rebound and no guarding.  Musculoskeletal: Normal range of motion. She exhibits edema. She exhibits no tenderness.  1+ bilateral lower extremity pitting edema. No calf asymmetry or tenderness.  Neurological:  Appears drowsy. Responds to painful stimuli. Appears to be moving all extremities.  Skin: Skin is warm and dry. No rash noted. No erythema.  Nursing note and vitals reviewed.   ED Course  Procedures (including critical care time) Labs Review Labs Reviewed  COMPREHENSIVE METABOLIC PANEL - Abnormal; Notable for the following:    Chloride 98 (*)    Glucose, Bld 249 (*)    Total Protein 8.2 (*)    AST 43 (*)    All other components within normal limits  CBC WITH DIFFERENTIAL/PLATELET - Abnormal; Notable for the following:  WBC 12.7 (*)    RBC 5.67 (*)    Hemoglobin 15.5 (*)    HCT 48.4 (*)    Neutro Abs 10.0 (*)    All other components within normal limits  BLOOD GAS, ARTERIAL - Abnormal; Notable for the following:    pH, Arterial 7.302 (*)    pCO2 arterial 64.5 (*)    pO2, Arterial 126 (*)    Bicarbonate 30.3 (*)    Acid-Base Excess 3.0 (*)    All other components within normal limits  I-STAT CG4 LACTIC ACID, ED - Abnormal; Notable for the following:    Lactic Acid, Venous 2.65 (*)    All other components within normal limits   CULTURE, BLOOD (ROUTINE X 2)  CULTURE, BLOOD (ROUTINE X 2)  URINE CULTURE  BRAIN NATRIURETIC PEPTIDE  URINALYSIS, ROUTINE W REFLEX MICROSCOPIC (NOT AT [facility])    Imaging Review Dg Chest Port 1 View  04/28/2015  CLINICAL DATA:  Difficulty breathing.  Metastatic colon cancer. EXAM: PORTABLE CHEST 1 VIEW COMPARISON:  12/20/2014 FINDINGS: Mild cardiomegaly with bibasilar airspace opacities and mid lung interstitial and faint airspace opacities noted. These have worsened compared to 12/20/2014. There are some Kerley B-lines. Blunting of the left costophrenic angle noted. IMPRESSION: 1. Bibasilar airspace opacities with bilateral interstitial accentuation and faint airspace opacities in the mid lungs. Suspected left pleural effusion. Differential diagnostic considerations include acute pulmonary edema and bilateral pneumonia. Correlate with any fever/leukocytosis. Given the history of malignancy, followup chest radiography to ensure clearance is likely warranted. Electronically Signed   By: Van Clines M.D.   On: 05/01/2015 20:24   I have personally reviewed and evaluated these images and lab results as part of my medical decision-making.   EKG Interpretation   Date/Time:  Wednesday May 11 2015 19:36:34 EDT Ventricular Rate:  121 PR Interval:  170 QRS Duration: 77 QT Interval:  305 QTC Calculation: 433 R Axis:   100 Text Interpretation:  Sinus tachycardia Ventricular premature complex  Probable left atrial enlargement Right axis deviation Borderline T  abnormalities, inferior leads Confirmed by Lita Mains  MD, Roshon Duell ([id number]) on  05/05/2015 9:18:14 PM     CRITICAL CARE Performed by: Lita Mains, Holland Kotter Total critical care time: 45 minutes Critical care time was exclusive of separately billable procedures and treating other patients. Critical care was necessary to treat or prevent imminent or life-threatening deterioration. Critical care was time spent personally by me on the following  activities: development of treatment plan with patient and/or surrogate as well as nursing, discussions with consultants, evaluation of patient's response to treatment, examination of patient, obtaining history from patient or surrogate, ordering and performing treatments and interventions, ordering and review of laboratory studies, ordering and review of radiographic studies, pulse oximetry and re-evaluation of patient's condition.  MDM   Final diagnoses:  Bilateral pneumonia  Hypoxia    Initial O2 saturations in the 50s. On nonrebreather and then on the BiPAP. Given nebulized treatment and Solu-Medrol. Patient now states that she is feeling much better. Her oxygen saturations have improved as is her work of breathing. Started on sepsis protocol with IV fluids and broad-spectrum antibiotics. Discussion with the patient relative and patient is full code at this point. We'll discuss with hospitalist admit to step down bed  Stress with Dr. Olevia Bowens and will admit to step down bed.  Julianne Rice, MD 05/19/2015 2133

## 2015-05-12 DIAGNOSIS — R739 Hyperglycemia, unspecified: Secondary | ICD-10-CM

## 2015-05-12 DIAGNOSIS — E669 Obesity, unspecified: Secondary | ICD-10-CM

## 2015-05-12 LAB — URINALYSIS, ROUTINE W REFLEX MICROSCOPIC
Bilirubin Urine: NEGATIVE
GLUCOSE, UA: NEGATIVE mg/dL
HGB URINE DIPSTICK: NEGATIVE
KETONES UR: NEGATIVE mg/dL
Leukocytes, UA: NEGATIVE
Nitrite: NEGATIVE
PH: 5 (ref 5.0–8.0)
PROTEIN: 100 mg/dL — AB
Specific Gravity, Urine: 1.029 (ref 1.005–1.030)

## 2015-05-12 LAB — COMPREHENSIVE METABOLIC PANEL
ALT: 33 U/L (ref 14–54)
AST: 32 U/L (ref 15–41)
Albumin: 3.1 g/dL — ABNORMAL LOW (ref 3.5–5.0)
Alkaline Phosphatase: 84 U/L (ref 38–126)
Anion gap: 8 (ref 5–15)
BUN: 21 mg/dL — AB (ref 6–20)
CHLORIDE: 106 mmol/L (ref 101–111)
CO2: 23 mmol/L (ref 22–32)
CREATININE: 0.69 mg/dL (ref 0.44–1.00)
Calcium: 7.8 mg/dL — ABNORMAL LOW (ref 8.9–10.3)
GFR calc Af Amer: >60 mL/min (ref >60)
Glucose, Bld: 318 mg/dL — ABNORMAL HIGH (ref 65–99)
POTASSIUM: 4.6 mmol/L (ref 3.5–5.1)
SODIUM: 137 mmol/L (ref 135–145)
Total Bilirubin: 0.7 mg/dL (ref 0.3–1.2)
Total Protein: 6.9 g/dL (ref 6.5–8.1)

## 2015-05-12 LAB — LACTIC ACID, PLASMA
LACTIC ACID, VENOUS: 1.7 mmol/L (ref 0.5–2.0)
Lactic Acid, Venous: 1.7 mmol/L (ref 0.5–2.0)

## 2015-05-12 LAB — CBC WITH DIFFERENTIAL/PLATELET
BASOS ABS: 0 10*3/uL (ref 0.0–0.1)
BASOS PCT: 0 %
EOS ABS: 0 10*3/uL (ref 0.0–0.7)
EOS PCT: 0 %
HCT: 46.2 % — ABNORMAL HIGH (ref 36.0–46.0)
Hemoglobin: 15 g/dL (ref 12.0–15.0)
LYMPHS PCT: 9 %
Lymphs Abs: 1.1 10*3/uL (ref 0.7–4.0)
MCH: 27.6 pg (ref 26.0–34.0)
MCHC: 32.5 g/dL (ref 30.0–36.0)
MCV: 85.1 fL (ref 78.0–100.0)
MONO ABS: 0.1 10*3/uL (ref 0.1–1.0)
Monocytes Relative: 1 %
Neutro Abs: 11.3 10*3/uL — ABNORMAL HIGH (ref 1.7–7.7)
Neutrophils Relative %: 90 %
PLATELETS: 342 10*3/uL (ref 150–400)
RBC: 5.43 MIL/uL — AB (ref 3.87–5.11)
RDW: 14.8 % (ref 11.5–15.5)
WBC: 12.5 10*3/uL — AB (ref 4.0–10.5)

## 2015-05-12 LAB — URINE MICROSCOPIC-ADD ON: RBC / HPF: NONE SEEN RBC/hpf (ref 0–5)

## 2015-05-12 LAB — GLUCOSE, CAPILLARY
GLUCOSE-CAPILLARY: 259 mg/dL — AB (ref 65–99)
Glucose-Capillary: 257 mg/dL — ABNORMAL HIGH (ref 65–99)
Glucose-Capillary: 196 mg/dL — ABNORMAL HIGH (ref 65–99)
Glucose-Capillary: 90 mg/dL (ref 65–99)

## 2015-05-12 LAB — BLOOD GAS, ARTERIAL
Acid-base deficit: 2.8 mmol/L — ABNORMAL HIGH (ref 0.0–2.0)
Bicarbonate: 25.7 mEq/L — ABNORMAL HIGH (ref 20.0–24.0)
DRAWN BY: 225631
Delivery systems: POSITIVE
Expiratory PAP: 8
FIO2: 0.8
Inspiratory PAP: 18
O2 SAT: 89.9 %
PATIENT TEMPERATURE: 98.6
PCO2 ART: 62.2 mmHg — AB (ref 35.0–45.0)
PO2 ART: 70.1 mmHg — AB (ref 80.0–100.0)
RATE: 10 resp/min
TCO2: 23.3 mmol/L (ref 0–100)
pH, Arterial: 7.24 — ABNORMAL LOW (ref 7.350–7.450)

## 2015-05-12 LAB — STREP PNEUMONIAE URINARY ANTIGEN: STREP PNEUMO URINARY ANTIGEN: NEGATIVE

## 2015-05-12 LAB — MRSA PCR SCREENING: MRSA BY PCR: NEGATIVE

## 2015-05-12 MED ORDER — VANCOMYCIN HCL IN DEXTROSE 750-5 MG/150ML-% IV SOLN
750.0000 mg | Freq: Two times a day (BID) | INTRAVENOUS | Status: DC
  Administered 2015-05-12 – 2015-05-13 (×3): 750 mg via INTRAVENOUS
  Filled 2015-05-12 (×3): qty 150

## 2015-05-12 MED ORDER — FUROSEMIDE 10 MG/ML IJ SOLN
20.0000 mg | Freq: Two times a day (BID) | INTRAMUSCULAR | Status: DC
  Administered 2015-05-12 – 2015-05-14 (×4): 20 mg via INTRAVENOUS
  Filled 2015-05-12 (×4): qty 2

## 2015-05-12 MED ORDER — BUDESONIDE 0.25 MG/2ML IN SUSP
0.2500 mg | Freq: Two times a day (BID) | RESPIRATORY_TRACT | Status: DC
  Administered 2015-05-12 – 2015-05-22 (×21): 0.25 mg via RESPIRATORY_TRACT
  Filled 2015-05-12 (×20): qty 2

## 2015-05-12 MED ORDER — CHLORHEXIDINE GLUCONATE 0.12 % MT SOLN
15.0000 mL | Freq: Two times a day (BID) | OROMUCOSAL | Status: DC
  Administered 2015-05-12 – 2015-05-19 (×12): 15 mL via OROMUCOSAL
  Filled 2015-05-12 (×8): qty 15

## 2015-05-12 MED ORDER — INSULIN GLARGINE 100 UNIT/ML ~~LOC~~ SOLN
10.0000 [IU] | Freq: Every day | SUBCUTANEOUS | Status: DC
  Administered 2015-05-12 – 2015-05-20 (×9): 10 [IU] via SUBCUTANEOUS
  Filled 2015-05-12 (×10): qty 0.1

## 2015-05-12 MED ORDER — DEXTROSE 5 % IV SOLN
1.0000 g | Freq: Two times a day (BID) | INTRAVENOUS | Status: DC
  Administered 2015-05-12 – 2015-05-13 (×3): 1 g via INTRAVENOUS
  Filled 2015-05-12 (×4): qty 1

## 2015-05-12 MED ORDER — CETYLPYRIDINIUM CHLORIDE 0.05 % MT LIQD
7.0000 mL | Freq: Two times a day (BID) | OROMUCOSAL | Status: DC
  Administered 2015-05-12 – 2015-05-19 (×12): 7 mL via OROMUCOSAL

## 2015-05-12 MED ORDER — SODIUM CHLORIDE 0.9 % IV BOLUS (SEPSIS)
1000.0000 mL | Freq: Once | INTRAVENOUS | Status: AC
Start: 2015-05-12 — End: 2015-05-12
  Administered 2015-05-12: 1000 mL via INTRAVENOUS

## 2015-05-12 NOTE — Progress Notes (Signed)
Inpatient Diabetes Program Recommendations  AACE/ADA: New Consensus Statement on Inpatient Glycemic Control (2015)  Target Ranges:  Prepandial:   less than 140 mg/dL      Peak postprandial:   less than 180 mg/dL (1-2 hours)      Critically ill patients:  140 - 180 mg/dL   Review of Glycemic Control  Diabetes history: None Outpatient Diabetes medications: None Current orders for Inpatient glycemic control: Novolog sensitive tidwc  Results for Hayley Lewis, Hayley Lewis (MRN [id number]) as of 05/12/2015 09:50  Ref. Range 05/12/2015 08:59  Glucose-Capillary Latest Ref Range: 65-99 mg/dL 257 (H)  Results for Hayley Lewis, Hayley Lewis (MRN [id number]) as of 05/12/2015 09:50  Ref. Range 05/02/2015 19:44 05/12/2015 02:47  Glucose Latest Ref Range: 65-99 mg/dL 249 (H) 318 (H)   On Solumedrol - No hx DM. Likely steroid-induced hyperglycemia. Started CHO mod heart healthy diet.   Inpatient Diabetes Program Recommendations:    Check HgbA1C to assess glycemic control prior to admission Add HS correction while on steroids.   Will follow. Thank you. Lorenda Peck, RD, LDN, CDE Inpatient Diabetes Coordinator [phone]

## 2015-05-12 NOTE — Progress Notes (Signed)
[facility] PROGRESS NOTE  Shealin Elijah [id number] DOB: 21-Feb-1924 DOA: 04/25/2015 PCP: No primary care provider on file.  Assessment/Plan: 1-acute on chronic resp failure: secondary to HCAP and vascular congestion/mild pulm edema -continue weaning oxygen supplementation as tolerated -start lasix IV -will check 2-D echo -continue nebulizer and current antibiotics -patient clinically improved and finally off BIPAP  2-sepsis: due to HCAP -sepsis features present on admission -slowly improving -will continue current antibiotics and sepsis protocol -follow culture data -follow clinical response  3-essential HTN: -given sepsis and need for lasix, will hold norvasc -BP is stable  4-adenocarcinoma of the Colon: with metastasis -continue outpatient follow up with oncology -has not achieved remission status  5-hyperglycemia -will check A1C -no prior hx of diabetes -continue SSI and low dose lantus for now -CBG's reached up to 300 range  6-obesity: Body mass index is 32.51 kg/(m^2). Low calorie diet discussed   Code Status: Full Family Communication: Son by phone [name]) and Grandson at bedside Disposition Plan: remains in stepdown bed; continue weaning oxygen as tolerated; (baseline is 2-3L [place] chronically); continue antibiotics and supportive care. started on pulmicort, IV lasix and flutter valve    Consultants:  None   Procedures:  See below for x-ray reports   Antibiotics:  Vancomycin and Cefepime 4/19  HPI/Subjective: No CP, breathing better and finally requiring less oxygen supplementation. Patient off BIPAP now. Spiked fever overnight   Objective: Filed Vitals:   05/12/15 1900 05/12/15 2007  BP: 146/82   Pulse: 94   Temp: 97.3 F (36.3 C) 97.3 F (36.3 C)  Resp: 19     Intake/Output Summary (Last 24 hours) at 05/12/15 2138 Last data filed at 05/12/15 1819  Gross per 24 hour  Intake 1520.83 ml  Output    730 ml  Net 790.83 ml   Filed  Weights   04/23/2015 2310  Weight: 75.5 kg (166 lb 7.2 oz)    Exam:   General: spiked fever overnight (TMaX 101.3); no CP and breathing slightly better. Off BIPAP and down to NRB mask. No hemoptysis and will like to have something to eat.  Cardiovascular: S1 and S2, no rubs or gallops  Respiratory: bibasilar crackles, slight wheezing and diffuse rhonchi   Abdomen: soft, NT, ND, positive BS  Musculoskeletal: 1 plus edema bilaterally; no cyanosis    Data Reviewed: Basic Metabolic Panel:  Recent Labs Lab 05/02/2015 1944 05/12/15 0247  NA 137 137  K 4.6 4.6  CL 98* 106  CO2 27 23  GLUCOSE 249* 318*  BUN 17 21*  CREATININE 0.57 0.69  CALCIUM 9.0 7.8*   Liver Function Tests:  Recent Labs Lab 05/08/2015 1944 05/12/15 0247  AST 43* 32  ALT 32 33  ALKPHOS 96 84  BILITOT 0.7 0.7  PROT 8.2* 6.9  ALBUMIN 3.7 3.1*   CBC:  Recent Labs Lab 05/16/2015 1944 05/12/15 0247  WBC 12.7* 12.5*  NEUTROABS 10.0* 11.3*  HGB 15.5* 15.0  HCT 48.4* 46.2*  MCV 85.4 85.1  PLT 362 342   BNP (last 3 results)  Recent Labs  12/20/14 1532 04/24/2015 1944  BNP 30.2 53.4   CBG:  Recent Labs Lab 05/12/15 0859 05/12/15 1113 05/12/15 1618 05/12/15 2131  GLUCAP 257* 259* 196* 90    Recent Results (from the past 240 hour(s))  MRSA PCR Screening     Status: None   Collection Time: 05/12/15  9:41 AM  Result Value Ref Range Status   MRSA by PCR NEGATIVE NEGATIVE Final    Comment:  The GeneXpert MRSA Assay (FDA approved for NASAL specimens only), is one component of a comprehensive MRSA colonization surveillance program. It is not intended to diagnose MRSA infection nor to guide or monitor treatment for MRSA infections.      Studies: Dg Chest Port 1 View  05/13/2015  CLINICAL DATA:  Difficulty breathing.  Metastatic colon cancer. EXAM: PORTABLE CHEST 1 VIEW COMPARISON:  12/20/2014 FINDINGS: Mild cardiomegaly with bibasilar airspace opacities and mid lung interstitial  and faint airspace opacities noted. These have worsened compared to 12/20/2014. There are some Kerley B-lines. Blunting of the left costophrenic angle noted. IMPRESSION: 1. Bibasilar airspace opacities with bilateral interstitial accentuation and faint airspace opacities in the mid lungs. Suspected left pleural effusion. Differential diagnostic considerations include acute pulmonary edema and bilateral pneumonia. Correlate with any fever/leukocytosis. Given the history of malignancy, followup chest radiography to ensure clearance is likely warranted. Electronically Signed   By: Van Clines M.D.   On: 04/23/2015 20:24    Scheduled Meds: . antiseptic oral rinse  7 mL Mouth Rinse q12n4p  . budesonide (PULMICORT) nebulizer solution  0.25 mg Nebulization BID  . ceFEPime (MAXIPIME) IV  1 g Intravenous Q12H  . chlorhexidine  15 mL Mouth Rinse BID  . enoxaparin (LOVENOX) injection  40 mg Subcutaneous QHS  . furosemide  20 mg Intravenous Q12H  . insulin aspart  0-9 Units Subcutaneous TID WC  . insulin glargine  10 Units Subcutaneous QHS  . vancomycin  750 mg Intravenous Q12H   Continuous Infusions: . albuterol Stopped (05/05/2015 2115)    Principal Problem:   HCAP (healthcare-associated pneumonia) Active Problems:   Acute respiratory failure with hypoxia ([place])   Essential hypertension   Adenocarcinoma of colon ([place])   Sepsis due to pneumonia [facility])    Time spent: 55 minutes    Barton Dubois  [facility] Pager [phone]. If 7PM-7AM, please contact night-coverage at [email or web address], password [facility] 05/12/2015, 9:38 PM  LOS: 1 day

## 2015-05-13 ENCOUNTER — Inpatient Hospital Stay (HOSPITAL_COMMUNITY): Payer: Medicaid Other

## 2015-05-13 DIAGNOSIS — R0600 Dyspnea, unspecified: Secondary | ICD-10-CM

## 2015-05-13 LAB — COMPREHENSIVE METABOLIC PANEL
ALBUMIN: 3.2 g/dL — AB (ref 3.5–5.0)
ALK PHOS: 71 U/L (ref 38–126)
ALT: 42 U/L (ref 14–54)
ANION GAP: 7 (ref 5–15)
AST: 47 U/L — ABNORMAL HIGH (ref 15–41)
BUN: 29 mg/dL — ABNORMAL HIGH (ref 6–20)
CALCIUM: 7.7 mg/dL — AB (ref 8.9–10.3)
CHLORIDE: 106 mmol/L (ref 101–111)
CO2: 27 mmol/L (ref 22–32)
Creatinine, Ser: 0.63 mg/dL (ref 0.44–1.00)
GFR calc Af Amer: >60 mL/min (ref >60)
GFR calc non Af Amer: >60 mL/min (ref >60)
GLUCOSE: 156 mg/dL — AB (ref 65–99)
POTASSIUM: 4.4 mmol/L (ref 3.5–5.1)
SODIUM: 140 mmol/L (ref 135–145)
Total Bilirubin: 0.3 mg/dL (ref 0.3–1.2)
Total Protein: 6.9 g/dL (ref 6.5–8.1)

## 2015-05-13 LAB — CBC WITH DIFFERENTIAL/PLATELET
Basophils Absolute: 0 K/uL (ref 0.0–0.1)
Basophils Relative: 0 %
Eosinophils Absolute: 0 K/uL (ref 0.0–0.7)
Eosinophils Relative: 0 %
HCT: 46.3 % — ABNORMAL HIGH (ref 36.0–46.0)
Hemoglobin: 14.1 g/dL (ref 12.0–15.0)
Lymphocytes Relative: 14 %
Lymphs Abs: 2 K/uL (ref 0.7–4.0)
MCH: 26.8 pg (ref 26.0–34.0)
MCHC: 30.5 g/dL (ref 30.0–36.0)
MCV: 87.9 fL (ref 78.0–100.0)
Monocytes Absolute: 1.6 K/uL — ABNORMAL HIGH (ref 0.1–1.0)
Monocytes Relative: 11 %
Neutro Abs: 10.5 K/uL — ABNORMAL HIGH (ref 1.7–7.7)
Neutrophils Relative %: 75 %
Platelets: 333 K/uL (ref 150–400)
RBC: 5.27 MIL/uL — ABNORMAL HIGH (ref 3.87–5.11)
RDW: 15.2 % (ref 11.5–15.5)
WBC: 14.1 K/uL — ABNORMAL HIGH (ref 4.0–10.5)

## 2015-05-13 LAB — GLUCOSE, CAPILLARY
GLUCOSE-CAPILLARY: 115 mg/dL — AB (ref 65–99)
GLUCOSE-CAPILLARY: 103 mg/dL — AB (ref 65–99)
GLUCOSE-CAPILLARY: 91 mg/dL (ref 65–99)
Glucose-Capillary: 146 mg/dL — ABNORMAL HIGH (ref 65–99)

## 2015-05-13 LAB — ECHOCARDIOGRAM COMPLETE
HEIGHTINCHES: 60 in
WEIGHTICAEL: 2663.16 [oz_av]

## 2015-05-13 LAB — URINE CULTURE: CULTURE: NO GROWTH

## 2015-05-13 LAB — LEGIONELLA PNEUMOPHILA SEROGP 1 UR AG: L. PNEUMOPHILA SEROGP 1 UR AG: NEGATIVE

## 2015-05-13 LAB — HEMOGLOBIN A1C
HEMOGLOBIN A1C: 8.7 % — AB (ref 4.8–5.6)
MEAN PLASMA GLUCOSE: 203 mg/dL

## 2015-05-13 MED ORDER — HYDROCODONE-ACETAMINOPHEN 5-325 MG PO TABS
1.0000 | ORAL_TABLET | Freq: Four times a day (QID) | ORAL | Status: DC | PRN
  Administered 2015-05-13 (×2): 1 via ORAL
  Filled 2015-05-13 (×2): qty 1

## 2015-05-13 MED ORDER — DEXTROSE 5 % IV SOLN
2.0000 g | INTRAVENOUS | Status: AC
  Administered 2015-05-13 – 2015-05-21 (×9): 2 g via INTRAVENOUS
  Filled 2015-05-13 (×9): qty 2

## 2015-05-13 MED ORDER — AMLODIPINE BESYLATE 5 MG PO TABS
5.0000 mg | ORAL_TABLET | Freq: Every day | ORAL | Status: DC
  Administered 2015-05-14 – 2015-05-15 (×3): 5 mg via ORAL
  Filled 2015-05-13 (×3): qty 1

## 2015-05-13 NOTE — Progress Notes (Signed)
Rt can not do flutter valve with pt at this time. Pt is on 55%VM that can not be removed due to O2 decreasing.

## 2015-05-13 NOTE — Progress Notes (Signed)
Pharmacy Antibiotic Note  Hayley Lewis is a 82 y.o. female admitted on 05/09/2015 with pneumonia. Pharmacy has been consulted for vancomycin/cefepime dosing.  Patient's currently on day #2 of abx.  - now afeb, WBC up 14.1, SCr stable with Crcl ~ 44 (~44 N using rounded SCr 1 for age), ANC high   Plan: - Vancomycin 1Gm x1 the 750mg  IV q12h --> Patient's MRSA PCR nasal swab is NEGATIVE.  A negative MRSA PCR is consistent with >98% negative predictive value in correlating with negative MRSA pneumonia.  Consider stopping vancomycin. - change Cefepime to 2 Gm IV q24h for renal function  Height: 5' (152.4 cm) (estimate from granddaughter.) Weight: 166 lb 7.2 oz (75.5 kg) IBW/kg (Calculated) : 45.5  Temp (24hrs), Avg:97.8 F (36.6 C), Min:96.8 F (36 C), Max:99.3 F (37.4 C)   Recent Labs Lab 04/27/2015 1944 05/15/2015 1952 05/09/2015 2340 05/12/15 0247 05/13/15 0310  WBC 12.7*  --   --  12.5* 14.1*  CREATININE 0.57  --   --  0.69 0.63  LATICACIDVEN  --  2.65* 1.7 1.7  --     Estimated Creatinine Clearance: 44.1 mL/min (by C-G formula based on Cr of 0.63).    No Known Allergies  Antimicrobials this admission: 4/19 zosyn >> x1 ED 4/20 cefepime >>  4/20 vancomycin >>  Dose adjustments this admission: n/a  Microbiology results: 4/19 BCx x2:  4/19 UCx:  4/19 Strep pneumo Ur Ag: negative 4/19 Legionella Ur Ag:  4/20 MRSA PCR (-)   Thank you for allowing pharmacy to be a part of this patient's care.  Dia Sitter P 05/13/2015 8:00 AM

## 2015-05-13 NOTE — Progress Notes (Signed)
[facility] PROGRESS NOTE  Hayley Lewis [id number] DOB: Apr 19, 1924 DOA: 05/18/2015 PCP: No primary care provider on file.  Assessment/Plan: 1-acute on chronic resp failure: secondary to HCAP and vascular congestion/mild pulm edema -continue weaning oxygen supplementation as tolerated -will check 2-D echo -continue nebulizer and current antibiotics -patient clinically improving and off BIPAP for over 24 hours now -will follow daily weights and intake/output -continue IV lasix and antibiotics for PNA  2-sepsis: due to HCAP -sepsis features present on admission -slowly improving/resolving  -will continue cefepime; given neg MRSA swab will discontinue vancomycin -follow culture data -follow clinical response -continue flutter valve   3-essential HTN: -given sepsis and need for lasix, will hold norvasc -BP is stable  4-adenocarcinoma of the Colon: with metastasis -continue outpatient follow up with oncology -has not achieved remission status  5-hyperglycemia -will follow A1C -no prior hx of diabetes -continue SSI and low dose lantus for now -CBG's reached up to 300 range on 4/20  6-obesity: Body mass index is 32.51 kg/(m^2). Low calorie diet discussed   Code Status: Full Family Communication: Son by phone [name]) and Grandson at bedside Disposition Plan: remains in stepdown bed; continue weaning oxygen as tolerated; (baseline is 2-3L [place] chronically); continue antibiotics (but will d/c vancomycin and Nasal Swab for MRSA was neg) and supportive care. Continue pulmicort, IV lasix and flutter valve    Consultants:  None   Procedures:  See below for x-ray reports   Antibiotics:  Vancomycin and Cefepime 4/19  HPI/Subjective: No CP, No fever. Off BIPAP for over 24 now; but still requiring high flow O2 supplementation and tachypeneic. Patient with difficulty speaking in full sentences and complaining of pain in her shoulders (which is  chronic).  Objective: Filed Vitals:   05/13/15 0500 05/13/15 0600  BP: 129/61 133/68  Pulse: 78 73  Temp: 99.1 F (37.3 C) 99.3 F (37.4 C)  Resp: 24 22    Intake/Output Summary (Last 24 hours) at 05/13/15 0912 Last data filed at 05/13/15 0630  Gross per 24 hour  Intake    300 ml  Output   1670 ml  Net  -1370 ml   Filed Weights   04/28/2015 2310  Weight: 75.5 kg (166 lb 7.2 oz)    Exam:   General: currently afebrile, no CP and breathing slightly better. Off BIPAP for over 24 hours now and down to NRB/venturi mask. No hemoptysis and complaining of chronic pain in her shoulders  Cardiovascular: S1 and S2, no rubs or gallops  Respiratory: mild bibasilar crackles, slight exp wheezing and positive rhonchi; with improvement overall on her air movement. No using accessory muscles    Abdomen: soft, NT, ND, positive BS  Musculoskeletal: 1 plus edema bilaterally; no cyanosis    Data Reviewed: Basic Metabolic Panel:  Recent Labs Lab 05/14/2015 1944 05/12/15 0247 05/13/15 0310  NA 137 137 140  K 4.6 4.6 4.4  CL 98* 106 106  CO2 27 23 27   GLUCOSE 249* 318* 156*  BUN 17 21* 29*  CREATININE 0.57 0.69 0.63  CALCIUM 9.0 7.8* 7.7*   Liver Function Tests:  Recent Labs Lab 04/29/2015 1944 05/12/15 0247 05/13/15 0310  AST 43* 32 47*  ALT 32 33 42  ALKPHOS 96 84 71  BILITOT 0.7 0.7 0.3  PROT 8.2* 6.9 6.9  ALBUMIN 3.7 3.1* 3.2*   CBC:  Recent Labs Lab 05/14/2015 1944 05/12/15 0247 05/13/15 0310  WBC 12.7* 12.5* 14.1*  NEUTROABS 10.0* 11.3* 10.5*  HGB 15.5* 15.0 14.1  HCT 48.4*  46.2* 46.3*  MCV 85.4 85.1 87.9  PLT 362 342 333   BNP (last 3 results)  Recent Labs  12/20/14 1532 05/05/2015 1944  BNP 30.2 53.4   CBG:  Recent Labs Lab 05/12/15 0859 05/12/15 1113 05/12/15 1618 05/12/15 2131 05/13/15 0729  GLUCAP 257* 259* 196* 90 146*    Recent Results (from the past 240 hour(s))  MRSA PCR Screening     Status: None   Collection Time: 05/12/15  9:41 AM   Result Value Ref Range Status   MRSA by PCR NEGATIVE NEGATIVE Final    Comment:        The GeneXpert MRSA Assay (FDA approved for NASAL specimens only), is one component of a comprehensive MRSA colonization surveillance program. It is not intended to diagnose MRSA infection nor to guide or monitor treatment for MRSA infections.      Studies: Dg Chest Port 1 View  05/19/2015  CLINICAL DATA:  Difficulty breathing.  Metastatic colon cancer. EXAM: PORTABLE CHEST 1 VIEW COMPARISON:  12/20/2014 FINDINGS: Mild cardiomegaly with bibasilar airspace opacities and mid lung interstitial and faint airspace opacities noted. These have worsened compared to 12/20/2014. There are some Kerley B-lines. Blunting of the left costophrenic angle noted. IMPRESSION: 1. Bibasilar airspace opacities with bilateral interstitial accentuation and faint airspace opacities in the mid lungs. Suspected left pleural effusion. Differential diagnostic considerations include acute pulmonary edema and bilateral pneumonia. Correlate with any fever/leukocytosis. Given the history of malignancy, followup chest radiography to ensure clearance is likely warranted. Electronically Signed   By: Van Clines M.D.   On: 05/13/2015 20:24    Scheduled Meds: . antiseptic oral rinse  7 mL Mouth Rinse q12n4p  . budesonide (PULMICORT) nebulizer solution  0.25 mg Nebulization BID  . ceFEPime (MAXIPIME) IV  2 g Intravenous Q24H  . chlorhexidine  15 mL Mouth Rinse BID  . enoxaparin (LOVENOX) injection  40 mg Subcutaneous QHS  . furosemide  20 mg Intravenous Q12H  . insulin aspart  0-9 Units Subcutaneous TID WC  . insulin glargine  10 Units Subcutaneous QHS   Continuous Infusions: . albuterol Stopped (04/25/2015 2115)    Principal Problem:   HCAP (healthcare-associated pneumonia) Active Problems:   Acute respiratory failure with hypoxia (HCC)   Essential hypertension   Adenocarcinoma of colon ([place])   Sepsis due to pneumonia  ([place])    Time spent: 35 minutes (> 50% of time dedicated to face to face examination, updates to family at bedside and coordination of care)    [place], [place] [facility] Pager [phone]. If 7PM-7AM, please contact night-coverage at [email or web address], password [facility] 05/13/2015, 9:12 AM  LOS: 2 days

## 2015-05-13 NOTE — Progress Notes (Signed)
Echocardiogram 2D Echocardiogram has been performed.  Hayley Lewis 05/13/2015, 1:44 PM

## 2015-05-13 NOTE — Progress Notes (Signed)
Inpatient Diabetes Program Recommendations  AACE/ADA: New Consensus Statement on Inpatient Glycemic Control (2015)  Target Ranges:  Prepandial:   less than 140 mg/dL      Peak postprandial:   less than 180 mg/dL (1-2 hours)      Critically ill patients:  140 - 180 mg/dL   Review of Glycemic Control  Results for Hayley Lewis, Hayley Lewis (MRN [id number]) as of 05/13/2015 10:07  Ref. Range 05/12/2015 02:47  Hemoglobin A1C Latest Ref Range: 4.8-5.6 % 8.7 (H)  Results for Hayley Lewis, Hayley Lewis (MRN [id number]) as of 05/13/2015 10:07  Ref. Range 05/12/2015 08:59 05/12/2015 11:13 05/12/2015 16:18 05/12/2015 21:31 05/13/2015 07:29  Glucose-Capillary Latest Ref Range: 65-99 mg/dL 257 (H) 259 (H) 196 (H) 90 146 (H)  Blood sugars improving. HgbA1C indicates diagnosis of DM. Will order Living Well With Diabetes and speak with pt's grandson regarding diagnosis.  Inpatient Diabetes Program Recommendations:    If pt is to go home on insulin, will need affordable insulin with no insurance. Recommend 70/30 10 units bid. Will need PCP to manage DM at discharge. Will need prescription for glucose meter and supplies.  Will continue to follow. Thank you. Lorenda Peck, RD, LDN, CDE Inpatient Diabetes Coordinator [phone]

## 2015-05-14 DIAGNOSIS — E1165 Type 2 diabetes mellitus with hyperglycemia: Secondary | ICD-10-CM

## 2015-05-14 LAB — CBC WITH DIFFERENTIAL/PLATELET
BASOS ABS: 0 10*3/uL (ref 0.0–0.1)
Basophils Relative: 0 %
EOS ABS: 0.1 10*3/uL (ref 0.0–0.7)
EOS PCT: 1 %
HCT: 46 % (ref 36.0–46.0)
Hemoglobin: 14 g/dL (ref 12.0–15.0)
Lymphocytes Relative: 14 %
Lymphs Abs: 1.7 10*3/uL (ref 0.7–4.0)
MCH: 26.7 pg (ref 26.0–34.0)
MCHC: 30.4 g/dL (ref 30.0–36.0)
MCV: 87.8 fL (ref 78.0–100.0)
Monocytes Absolute: 1.2 10*3/uL — ABNORMAL HIGH (ref 0.1–1.0)
Monocytes Relative: 10 %
Neutro Abs: 9 10*3/uL — ABNORMAL HIGH (ref 1.7–7.7)
Neutrophils Relative %: 75 %
PLATELETS: 322 10*3/uL (ref 150–400)
RBC: 5.24 MIL/uL — AB (ref 3.87–5.11)
RDW: 15.1 % (ref 11.5–15.5)
WBC: 12.1 10*3/uL — AB (ref 4.0–10.5)

## 2015-05-14 LAB — GLUCOSE, CAPILLARY
GLUCOSE-CAPILLARY: 89 mg/dL (ref 65–99)
Glucose-Capillary: 115 mg/dL — ABNORMAL HIGH (ref 65–99)
Glucose-Capillary: 149 mg/dL — ABNORMAL HIGH (ref 65–99)
Glucose-Capillary: 78 mg/dL (ref 65–99)
Glucose-Capillary: 99 mg/dL (ref 65–99)

## 2015-05-14 LAB — COMPREHENSIVE METABOLIC PANEL
ALT: 55 U/L — AB (ref 14–54)
AST: 42 U/L — AB (ref 15–41)
Albumin: 3.3 g/dL — ABNORMAL LOW (ref 3.5–5.0)
Alkaline Phosphatase: 71 U/L (ref 38–126)
Anion gap: 5 (ref 5–15)
BUN: 21 mg/dL — ABNORMAL HIGH (ref 6–20)
CHLORIDE: 104 mmol/L (ref 101–111)
CO2: 30 mmol/L (ref 22–32)
CREATININE: 0.6 mg/dL (ref 0.44–1.00)
Calcium: 7.5 mg/dL — ABNORMAL LOW (ref 8.9–10.3)
GFR calc non Af Amer: >60 mL/min (ref >60)
Glucose, Bld: 107 mg/dL — ABNORMAL HIGH (ref 65–99)
POTASSIUM: 3.8 mmol/L (ref 3.5–5.1)
SODIUM: 139 mmol/L (ref 135–145)
Total Bilirubin: 0.7 mg/dL (ref 0.3–1.2)
Total Protein: 7.1 g/dL (ref 6.5–8.1)

## 2015-05-14 LAB — HEMOGLOBIN A1C
Hgb A1c MFr Bld: 8.8 % — ABNORMAL HIGH (ref 4.8–5.6)
MEAN PLASMA GLUCOSE: 206 mg/dL

## 2015-05-14 MED ORDER — TRAMADOL HCL 50 MG PO TABS
50.0000 mg | ORAL_TABLET | Freq: Four times a day (QID) | ORAL | Status: DC | PRN
Start: 2015-05-14 — End: 2015-05-17

## 2015-05-14 MED ORDER — FUROSEMIDE 10 MG/ML IJ SOLN
30.0000 mg | Freq: Two times a day (BID) | INTRAMUSCULAR | Status: DC
  Administered 2015-05-14 – 2015-05-17 (×7): 30 mg via INTRAVENOUS
  Filled 2015-05-14 (×7): qty 4

## 2015-05-14 MED ORDER — HYDROCODONE-ACETAMINOPHEN 5-325 MG PO TABS: 2.0000 | ORAL_TABLET | Freq: Four times a day (QID) | ORAL | Status: DC | PRN

## 2015-05-14 MED ORDER — ENSURE ENLIVE PO LIQD
237.0000 mL | Freq: Three times a day (TID) | ORAL | Status: DC
  Administered 2015-05-14 – 2015-05-22 (×10): 237 mL via ORAL

## 2015-05-14 MED ORDER — ACETAMINOPHEN 325 MG PO TABS
650.0000 mg | ORAL_TABLET | ORAL | Status: DC | PRN
  Administered 2015-05-14 – 2015-05-20 (×6): 650 mg via ORAL
  Filled 2015-05-14 (×6): qty 2

## 2015-05-14 NOTE — Progress Notes (Signed)
[facility] PROGRESS NOTE  Hayley Lewis [id number] DOB: [birth year] DOA: 04/23/2015 PCP: No primary care provider on file.  Assessment/Plan: 1-acute on chronic resp failure: secondary to HCAP and vascular congestion/mild pulm edema -continue weaning oxygen supplementation as tolerated -2-D echo suggesting component of diastolic heart failure; no wall motion abnormalities and preserved EF -continue nebulizer and current antibiotics -patient clinically improving and off BIPAP for over 40 hours now; but still requiring high flow oxygen supplementation and with desaturation on minimal activity  -will follow daily weights and intake/output -continue IV lasix and antibiotics for PNA  2-sepsis: due to HCAP -sepsis features present on admission -slowly improving/resolving  -will continue cefepime; given neg MRSA swab I have discontinued vancomycin on 4/21 -follow culture data -follow clinical response -continue flutter valve   3-essential HTN: -given sepsis and need for lasix, will continue holding norvasc for now -BP is stable  4-adenocarcinoma of the Colon: with metastasis -continue outpatient follow up with oncology -has not achieved remission status  5-Type 2 Diabetes with hyperglycemia -A1C 8.8 -no prior hx of diabetes -will continue SSI and low dose lantus while inpatient -at discharge will favor for oral hypoglycemic regimen   6-obesity: Body mass index is 32.94 kg/(m^2). Low calorie diet discussed   7-acute on chronic diastolic heart failure: preserved EF -continue daily weights and strict I's and O's -might need chronic diuretic regimen to control volume   8-poor appetite: moderate protein calorie malnutrition  -will use ensure TID -nutrition service consulted   Code Status: Full Family Communication: Son by phone [name]) and Grandson at bedside Disposition Plan: remains in stepdown bed; continue weaning oxygen as tolerated; (baseline is 2-3L [place]  chronically); continue IV antibiotics and supportive care. Continue pulmicort, IV lasix and flutter valve.   Consultants:  None   Procedures:  See below for x-ray reports   2-D Echo - Left ventricle: The cavity size was normal. Wall thickness was  increased in a pattern of mild LVH. Systolic function was normal.  The estimated ejection fraction was in the range of 60% to 65%.  Wall motion was normal; there were no regional wall motion  abnormalities. Doppler parameters are consistent with abnormal  left ventricular relaxation (grade 1 diastolic dysfunction). - Ventricular septum: D-shaped interventricular septum suggesting  RV pressure/volume overload. - Aortic valve: There was no stenosis. - Mitral valve: Mildly calcified annulus. Mildly calcified leaflets  . There was trivial regurgitation. - Left atrium: The atrium was moderately dilated. - Right ventricle: The cavity size was mildly dilated. Systolic  function was normal. - Right atrium: The atrium was moderately dilated. - Tricuspid valve: Peak RV-RA gradient (S): 59 mm Hg. - Pulmonary arteries: PA peak pressure: 74 mm Hg (S). - Systemic veins: IVC measured 2.4 cm with < 50% respirpophasic  variation, suggesting RA pressure 15 mmHg.  Impressions: - Normal LV size with mild LV hypertrophy. EF 60-65%. Mildly  dilated RV with normal systolic function. D-shaped  interventricular septum suggestive of RV pressure/volume  overload. Moderate biatrial enlargement. Severe pulmonary  hypertension.  Antibiotics:  Vancomycin and Cefepime 4/19  HPI/Subjective: No CP, No fever. Breathing continue to improve slowly; good urine output. Still requiring high flow oxygen supplementation and with desaturation/hypoxia with minimal exertion.  Objective: Filed Vitals:   05/14/15 1000 05/14/15 1200  BP: 138/64   Pulse: 78   Temp: 99 F (37.2 C) 98.1 F (36.7 C)  Resp: 20     Intake/Output Summary (Last 24 hours) at  05/14/15 1505 Last data filed at  05/14/15 1335  Gross per 24 hour  Intake    170 ml  Output   2375 ml  Net  -2205 ml   Filed Weights   05/15/2015 2310 05/14/15 0450  Weight: 75.5 kg (166 lb 7.2 oz) 76.5 kg (168 lb 10.4 oz)    Exam:   General: currently afebrile, no CP and breathing slightly better. Still complaining of chronic pain in her shoulders and arms. Patient still requiring high flow oxygen and with poor appetite   Cardiovascular: S1 and S2, no rubs or gallops  Respiratory: improved air movement, mild bibasilar crackles still appreciated; no wheezing on exam today (4/22); No using accessory muscles    Abdomen: soft, NT, ND, positive BS  Musculoskeletal: trace to 1 plus edema bilaterally; no cyanosis    Data Reviewed: Basic Metabolic Panel:  Recent Labs Lab 04/26/2015 1944 05/12/15 0247 05/13/15 0310 05/14/15 0324  NA 137 137 140 139  K 4.6 4.6 4.4 3.8  CL 98* 106 106 104  CO2 27 23 27 30   GLUCOSE 249* 318* 156* 107*  BUN 17 21* 29* 21*  CREATININE 0.57 0.69 0.63 0.60  CALCIUM 9.0 7.8* 7.7* 7.5*   Liver Function Tests:  Recent Labs Lab 04/27/2015 1944 05/12/15 0247 05/13/15 0310 05/14/15 0324  AST 43* 32 47* 42*  ALT 32 33 42 55*  ALKPHOS 96 84 71 71  BILITOT 0.7 0.7 0.3 0.7  PROT 8.2* 6.9 6.9 7.1  ALBUMIN 3.7 3.1* 3.2* 3.3*   CBC:  Recent Labs Lab 05/19/2015 1944 05/12/15 0247 05/13/15 0310 05/14/15 0324  WBC 12.7* 12.5* 14.1* 12.1*  NEUTROABS 10.0* 11.3* 10.5* 9.0*  HGB 15.5* 15.0 14.1 14.0  HCT 48.4* 46.2* 46.3* 46.0  MCV 85.4 85.1 87.9 87.8  PLT 362 342 333 322   BNP (last 3 results)  Recent Labs  12/20/14 1532 05/10/2015 1944  BNP 30.2 53.4   CBG:  Recent Labs Lab 05/13/15 1159 05/13/15 1542 05/13/15 2233 05/14/15 0735 05/14/15 1128  GLUCAP 115* 103* 91 78 89    Recent Results (from the past 240 hour(s))  Blood Culture (routine x 2)     Status: None (Preliminary result)   Collection Time: 05/18/2015  7:40 PM  Result Value  Ref Range Status   Specimen Description BLOOD LEFT WRIST  Final   Special Requests BOTTLES DRAWN AEROBIC AND ANAEROBIC 5CC  Final   Culture   Final    NO GROWTH 2 DAYS Performed at [facility]    Report Status PENDING  Incomplete  Blood Culture (routine x 2)     Status: None (Preliminary result)   Collection Time: 05/05/2015  7:45 PM  Result Value Ref Range Status   Specimen Description BLOOD RIGHT AC  Final   Special Requests BOTTLES DRAWN AEROBIC AND ANAEROBIC 5CC  Final   Culture   Final    NO GROWTH 2 DAYS Performed at [facility]    Report Status PENDING  Incomplete  Urine culture     Status: None   Collection Time: 05/01/2015 11:31 PM  Result Value Ref Range Status   Specimen Description URINE, CLEAN CATCH  Final   Special Requests NONE  Final   Culture   Final    NO GROWTH 1 DAY Performed at [facility]    Report Status 05/13/2015 FINAL  Final  MRSA PCR Screening     Status: None   Collection Time: 05/12/15  9:41 AM  Result Value Ref Range Status   MRSA  by PCR NEGATIVE NEGATIVE Final    Comment:        The GeneXpert MRSA Assay (FDA approved for NASAL specimens only), is one component of a comprehensive MRSA colonization surveillance program. It is not intended to diagnose MRSA infection nor to guide or monitor treatment for MRSA infections.      Studies: Dg Chest 2 View  05/13/2015  CLINICAL DATA:  Acute SOB. Chest pains. Hx of HTN and colon cancer. EXAM: CHEST  2 VIEW COMPARISON:  05/20/2015 FINDINGS: The heart is enlarged. There are bibasilar opacities. Bilateral pleural effusions are present. Findings are consistent with pulmonary edema and/or infectious infiltrates and appear stable. IMPRESSION: 1. Cardiomegaly. 2. Stable bibasilar opacities consistent with infiltrates and effusions. Electronically Signed   By: Nolon Nations M.D.   On: 05/13/2015 12:21    Scheduled Meds: . amLODipine  5 mg Oral Daily  . antiseptic oral rinse  7 mL  Mouth Rinse q12n4p  . budesonide (PULMICORT) nebulizer solution  0.25 mg Nebulization BID  . ceFEPime (MAXIPIME) IV  2 g Intravenous Q24H  . chlorhexidine  15 mL Mouth Rinse BID  . enoxaparin (LOVENOX) injection  40 mg Subcutaneous QHS  . furosemide  30 mg Intravenous Q12H  . insulin aspart  0-9 Units Subcutaneous TID WC  . insulin glargine  10 Units Subcutaneous QHS   Continuous Infusions: . albuterol Stopped (05/12/2015 2115)    Principal Problem:   HCAP (healthcare-associated pneumonia) Active Problems:   Acute respiratory failure with hypoxia (HCC)   Essential hypertension   Adenocarcinoma of colon ([place])   Sepsis due to pneumonia ([place])    Time spent: 35 minutes (> 50% of time dedicated to face to face examination, updates to family at bedside and coordination of care)    [place], [place] [facility] Pager [phone]. If 7PM-7AM, please contact night-coverage at [email or web address], password [facility] 05/14/2015, 3:05 PM  LOS: 3 days

## 2015-05-15 ENCOUNTER — Other Ambulatory Visit: Payer: Self-pay

## 2015-05-15 DIAGNOSIS — E876 Hypokalemia: Secondary | ICD-10-CM

## 2015-05-15 LAB — BASIC METABOLIC PANEL
Anion gap: 10 (ref 5–15)
BUN: 19 mg/dL (ref 6–20)
CALCIUM: 7.7 mg/dL — AB (ref 8.9–10.3)
CHLORIDE: 99 mmol/L — AB (ref 101–111)
CO2: 34 mmol/L — ABNORMAL HIGH (ref 22–32)
CREATININE: 0.72 mg/dL (ref 0.44–1.00)
GFR calc Af Amer: >60 mL/min (ref >60)
GFR calc non Af Amer: >60 mL/min (ref >60)
Glucose, Bld: 96 mg/dL (ref 65–99)
Potassium: 3.3 mmol/L — ABNORMAL LOW (ref 3.5–5.1)
SODIUM: 143 mmol/L (ref 135–145)

## 2015-05-15 LAB — GLUCOSE, CAPILLARY
GLUCOSE-CAPILLARY: 81 mg/dL (ref 65–99)
GLUCOSE-CAPILLARY: 176 mg/dL — AB (ref 65–99)
GLUCOSE-CAPILLARY: 261 mg/dL — AB (ref 65–99)
Glucose-Capillary: 235 mg/dL — ABNORMAL HIGH (ref 65–99)

## 2015-05-15 MED ORDER — MUSCLE RUB 10-15 % EX CREA
TOPICAL_CREAM | CUTANEOUS | Status: DC | PRN
  Administered 2015-05-15: 18:00:00 via TOPICAL
  Administered 2015-05-15: 1 via TOPICAL
  Administered 2015-05-16: 12:00:00 via TOPICAL
  Filled 2015-05-15: qty 85

## 2015-05-15 MED ORDER — POTASSIUM CHLORIDE CRYS ER 20 MEQ PO TBCR
40.0000 meq | EXTENDED_RELEASE_TABLET | ORAL | Status: AC
  Administered 2015-05-15 (×3): 40 meq via ORAL
  Filled 2015-05-15 (×3): qty 2

## 2015-05-15 MED ORDER — METOPROLOL TARTRATE 1 MG/ML IV SOLN
5.0000 mg | Freq: Once | INTRAVENOUS | Status: AC
  Administered 2015-05-15: 5 mg via INTRAVENOUS
  Filled 2015-05-15: qty 5

## 2015-05-15 MED ORDER — METHYLPREDNISOLONE SODIUM SUCC 40 MG IJ SOLR
40.0000 mg | Freq: Two times a day (BID) | INTRAMUSCULAR | Status: DC
  Administered 2015-05-15 – 2015-05-16 (×3): 40 mg via INTRAVENOUS
  Filled 2015-05-15 (×3): qty 1

## 2015-05-15 NOTE — Progress Notes (Signed)
Patient requested that her money be kept in a safe place in the hospital, she wanted it kept from her grandchildren. She later on decided to give it to a family member who is visiting at this time, to keep for her, pending her discharge from the hospital. So the total amount of $206.80 was given to Mrs. Hayley Lewis [phone] by patient for safe keeping, and I am a witness to it.

## 2015-05-15 NOTE — Progress Notes (Signed)
[facility] PROGRESS NOTE  Hayley Lewis [id number] DOB: Oct 20, 1924 DOA: 05/04/2015 PCP: No primary care provider on file.  Assessment/Plan: 1-acute on chronic resp failure: secondary to HCAP and vascular congestion/mild pulm edema -continue weaning oxygen supplementation as tolerated -2-D echo suggesting component of diastolic heart failure; no wall motion abnormalities and preserved EF -continue nebulizer and current antibiotics -patient clinically improving and off BIPAP for over 40 hours now; but still requiring high flow oxygen supplementation and with desaturation on minimal activity  -will follow daily weights and intake/output -continue IV lasix and antibiotics for PNA  2-sepsis: due to HCAP -sepsis features present on admission -slowly improving/resolving  -will continue cefepime; given neg MRSA swab I have discontinued vancomycin on 4/21 -follow culture data -follow clinical response -continue flutter valve and weaned oxygen as toelrated   3-essential HTN: -given sepsis and need for lasix, will continue holding norvasc for now -BP is stable  4-adenocarcinoma of the Colon: with metastasis -continue outpatient follow up with oncology service (At [facility]) -Per Son info today has achieved remission status  5-Type 2 Diabetes with hyperglycemia -A1C 8.8 -no prior hx of diabetes -will continue SSI and low dose lantus while inpatient -at discharge will favor for oral hypoglycemic regimen   6-obesity: Body mass index is 32.94 kg/(m^2). Low calorie diet discussed   7-acute on chronic diastolic heart failure: preserved EF -continue daily weights and strict I's and O's -might need chronic diuretic regimen to control volume  -continue current dose of lasix -good urine output and extra fluid signs correcting   8-poor appetite: moderate protein calorie malnutrition  -will use ensure TID -nutrition service consulted   9-hypokalemia: -will replete as  needed -due to use of diuretics   Code Status: Full Family Communication: Son at bedside [name]) and Grandson/grand-nephew at bedside Disposition Plan: remains in stepdown bed; continue weaning oxygen as tolerated; (baseline is 2-3L [place] chronically); continue IV antibiotics and supportive care. Continue pulmicort, IV lasix and flutter valve.   Consultants:  None   Procedures:  See below for x-ray reports   2-D Echo - Left ventricle: The cavity size was normal. Wall thickness was  increased in a pattern of mild LVH. Systolic function was normal.  The estimated ejection fraction was in the range of 60% to 65%.  Wall motion was normal; there were no regional wall motion  abnormalities. Doppler parameters are consistent with abnormal  left ventricular relaxation (grade 1 diastolic dysfunction). - Ventricular septum: D-shaped interventricular septum suggesting  RV pressure/volume overload. - Aortic valve: There was no stenosis. - Mitral valve: Mildly calcified annulus. Mildly calcified leaflets  . There was trivial regurgitation. - Left atrium: The atrium was moderately dilated. - Right ventricle: The cavity size was mildly dilated. Systolic  function was normal. - Right atrium: The atrium was moderately dilated. - Tricuspid valve: Peak RV-RA gradient (S): 59 mm Hg. - Pulmonary arteries: PA peak pressure: 74 mm Hg (S). - Systemic veins: IVC measured 2.4 cm with < 50% respirpophasic  variation, suggesting RA pressure 15 mmHg.  Impressions: - Normal LV size with mild LV hypertrophy. EF 60-65%. Mildly  dilated RV with normal systolic function. D-shaped  interventricular septum suggestive of RV pressure/volume  overload. Moderate biatrial enlargement. Severe pulmonary  hypertension.  Antibiotics:  Vancomycin 4/19>>>4/21  Cefepime 4/19  HPI/Subjective: No CP, No fever; even she had low grade temp overnight. Breathing continue to improve slowly; good urine output.  Still requiring high flow oxygen supplementation and with desaturation/hypoxia with minimal exertion.  Objective:  Filed Vitals:   05/15/15 0900 05/15/15 0941  BP:  118/45  Pulse: 72   Temp: 98.8 F (37.1 C)   Resp: 20     Intake/Output Summary (Last 24 hours) at 05/15/15 1034 Last data filed at 05/15/15 0800  Gross per 24 hour  Intake    130 ml  Output   3350 ml  Net  -3220 ml   Filed Weights   05/09/2015 2310 05/14/15 0450  Weight: 75.5 kg (166 lb 7.2 oz) 76.5 kg (168 lb 10.4 oz)    Exam:   General: currently afebrile, but have low grade temp overnight. No CP at this moment and reports breathing slightly better. Still complaining of chronic pain in her shoulders and arms. Patient still requiring high flow oxygen and with poor appetite   Cardiovascular: S1 and S2, no rubs or gallops  Respiratory: improvement in air movement, mild bibasilar crackles still appreciated; positive exp wheezing on exam today (4/23); No using accessory muscles    Abdomen: soft, NT, ND, positive BS  Musculoskeletal: trace to 1 plus edema bilaterally; no cyanosis    Data Reviewed: Basic Metabolic Panel:  Recent Labs Lab 05/06/2015 1944 05/12/15 0247 05/13/15 0310 05/14/15 0324 05/15/15 0323  NA 137 137 140 139 143  K 4.6 4.6 4.4 3.8 3.3*  CL 98* 106 106 104 99*  CO2 27 23 27 30  34*  GLUCOSE 249* 318* 156* 107* 96  BUN 17 21* 29* 21* 19  CREATININE 0.57 0.69 0.63 0.60 0.72  CALCIUM 9.0 7.8* 7.7* 7.5* 7.7*   Liver Function Tests:  Recent Labs Lab 05/20/2015 1944 05/12/15 0247 05/13/15 0310 05/14/15 0324  AST 43* 32 47* 42*  ALT 32 33 42 55*  ALKPHOS 96 84 71 71  BILITOT 0.7 0.7 0.3 0.7  PROT 8.2* 6.9 6.9 7.1  ALBUMIN 3.7 3.1* 3.2* 3.3*   CBC:  Recent Labs Lab 05/02/2015 1944 05/12/15 0247 05/13/15 0310 05/14/15 0324  WBC 12.7* 12.5* 14.1* 12.1*  NEUTROABS 10.0* 11.3* 10.5* 9.0*  HGB 15.5* 15.0 14.1 14.0  HCT 48.4* 46.2* 46.3* 46.0  MCV 85.4 85.1 87.9 87.8  PLT 362 342  333 322   BNP (last 3 results)  Recent Labs  12/20/14 1532 05/07/2015 1944  BNP 30.2 53.4   CBG:  Recent Labs Lab 05/14/15 1128 05/14/15 1644 05/14/15 2137 05/15/15 05/15/15 0742  GLUCAP 89 149* 115* 99 81    Recent Results (from the past 240 hour(s))  Blood Culture (routine x 2)     Status: None (Preliminary result)   Collection Time: 05/04/2015  7:40 PM  Result Value Ref Range Status   Specimen Description BLOOD LEFT WRIST  Final   Special Requests BOTTLES DRAWN AEROBIC AND ANAEROBIC 5CC  Final   Culture   Final    NO GROWTH 2 DAYS Performed at [facility]    Report Status PENDING  Incomplete  Blood Culture (routine x 2)     Status: None (Preliminary result)   Collection Time: 04/28/2015  7:45 PM  Result Value Ref Range Status   Specimen Description BLOOD RIGHT AC  Final   Special Requests BOTTLES DRAWN AEROBIC AND ANAEROBIC 5CC  Final   Culture   Final    NO GROWTH 2 DAYS Performed at [facility]    Report Status PENDING  Incomplete  Urine culture     Status: None   Collection Time: 05/09/2015 11:31 PM  Result Value Ref Range Status   Specimen Description URINE, CLEAN CATCH  Final   Special Requests NONE  Final   Culture   Final    NO GROWTH 1 DAY Performed at [facility]    Report Status 05/13/2015 FINAL  Final  MRSA PCR Screening     Status: None   Collection Time: 05/12/15  9:41 AM  Result Value Ref Range Status   MRSA by PCR NEGATIVE NEGATIVE Final    Comment:        The GeneXpert MRSA Assay (FDA approved for NASAL specimens only), is one component of a comprehensive MRSA colonization surveillance program. It is not intended to diagnose MRSA infection nor to guide or monitor treatment for MRSA infections.      Studies: Dg Chest 2 View  05/13/2015  CLINICAL DATA:  Acute SOB. Chest pains. Hx of HTN and colon cancer. EXAM: CHEST  2 VIEW COMPARISON:  05/04/2015 FINDINGS: The heart is enlarged. There are bibasilar opacities.  Bilateral pleural effusions are present. Findings are consistent with pulmonary edema and/or infectious infiltrates and appear stable. IMPRESSION: 1. Cardiomegaly. 2. Stable bibasilar opacities consistent with infiltrates and effusions. Electronically Signed   By: Nolon Nations M.D.   On: 05/13/2015 12:21    Scheduled Meds: . amLODipine  5 mg Oral Daily  . antiseptic oral rinse  7 mL Mouth Rinse q12n4p  . budesonide (PULMICORT) nebulizer solution  0.25 mg Nebulization BID  . ceFEPime (MAXIPIME) IV  2 g Intravenous Q24H  . chlorhexidine  15 mL Mouth Rinse BID  . enoxaparin (LOVENOX) injection  40 mg Subcutaneous QHS  . feeding supplement (ENSURE ENLIVE)  237 mL Oral TID BM  . furosemide  30 mg Intravenous Q12H  . insulin aspart  0-9 Units Subcutaneous TID WC  . insulin glargine  10 Units Subcutaneous QHS  . potassium chloride  40 mEq Oral Q4H   Continuous Infusions: . albuterol Stopped (04/25/2015 2115)    Principal Problem:   HCAP (healthcare-associated pneumonia) Active Problems:   Acute respiratory failure with hypoxia (HCC)   Essential hypertension   Adenocarcinoma of colon ([place])   Sepsis due to pneumonia ([place])    Time spent: 35 minutes (> 50% of time dedicated to face to face examination, updates to family at bedside and coordination of care)    [place] [place] [facility] Pager [phone]. If 7PM-7AM, please contact night-coverage at [email or web address], password [facility] 05/15/2015, 10:34 AM  LOS: 4 days

## 2015-05-15 NOTE — Progress Notes (Addendum)
Called to pt bedside by RN.  Pt noted for respiratory distress and increased wob with accessory muscle use, RR30s, spo2 84%.  Pt placed on bipap and tolerating well at this time.  RN aware, at bedside.  RT will continue to monitor and assess pt.

## 2015-05-15 NOTE — Progress Notes (Signed)
Attempted O2 titration multiple times throughout the shift. Patient did not tolerate anything lower than 14L.  Will continue tommorow.

## 2015-05-16 ENCOUNTER — Inpatient Hospital Stay (HOSPITAL_COMMUNITY): Payer: Medicaid Other

## 2015-05-16 ENCOUNTER — Encounter (HOSPITAL_COMMUNITY): Payer: Self-pay | Admitting: Radiology

## 2015-05-16 DIAGNOSIS — J9601 Acute respiratory failure with hypoxia: Secondary | ICD-10-CM

## 2015-05-16 DIAGNOSIS — I272 Other secondary pulmonary hypertension: Secondary | ICD-10-CM

## 2015-05-16 LAB — GLUCOSE, CAPILLARY
Glucose-Capillary: 200 mg/dL — ABNORMAL HIGH (ref 65–99)
Glucose-Capillary: 164 mg/dL — ABNORMAL HIGH (ref 65–99)
Glucose-Capillary: 148 mg/dL — ABNORMAL HIGH (ref 65–99)

## 2015-05-16 LAB — BASIC METABOLIC PANEL
ANION GAP: 8 (ref 5–15)
BUN: 23 mg/dL — ABNORMAL HIGH (ref 6–20)
CALCIUM: 8.3 mg/dL — AB (ref 8.9–10.3)
CO2: 37 mmol/L — AB (ref 22–32)
Chloride: 99 mmol/L — ABNORMAL LOW (ref 101–111)
Creatinine, Ser: 0.62 mg/dL (ref 0.44–1.00)
Glucose, Bld: 253 mg/dL — ABNORMAL HIGH (ref 65–99)
POTASSIUM: 4.7 mmol/L (ref 3.5–5.1)
Sodium: 144 mmol/L (ref 135–145)

## 2015-05-16 LAB — BLOOD GAS, ARTERIAL
ACID-BASE EXCESS: 11.9 mmol/L — AB (ref 0.0–2.0)
BICARBONATE: 38.8 meq/L — AB (ref 20.0–24.0)
Delivery systems: POSITIVE
Drawn by: 277551
Expiratory PAP: 6
FIO2: 0.6
INSPIRATORY PAP: 22
O2 Saturation: 93.5 %
PCO2 ART: 59.5 mmHg — AB (ref 35.0–45.0)
PH ART: 7.43 (ref 7.350–7.450)
Patient temperature: 98.6
TCO2: 33.6 mmol/L (ref 0–100)
pO2, Arterial: 68.5 mmHg — ABNORMAL LOW (ref 80.0–100.0)

## 2015-05-16 MED ORDER — HYDRALAZINE HCL 20 MG/ML IJ SOLN
10.0000 mg | Freq: Four times a day (QID) | INTRAMUSCULAR | Status: DC
  Administered 2015-05-16 – 2015-05-22 (×24): 10 mg via INTRAVENOUS
  Filled 2015-05-16 (×24): qty 1

## 2015-05-16 NOTE — Progress Notes (Signed)
Initial Nutrition Assessment  DOCUMENTATION CODES:   Obesity unspecified  INTERVENTION:   Continue Ensure Enlive po BID, each supplement provides 350 kcal and 20 grams of protein RD to follow-up at a later time to complete assessment and speak with family.  NUTRITION DIAGNOSIS:   Inadequate oral intake related to inability to eat (on bipap) as evidenced by meal completion < 25%.  GOAL:   Patient will meet greater than or equal to 90% of their needs  MONITOR:   PO intake, Supplement acceptance, Labs, Weight trends, I & O's  REASON FOR ASSESSMENT:   Consult Assessment of nutrition requirement/status  ASSESSMENT:   82 y.o. female with medical history significant of hypertension, metastatic colon cancer who was brought to the emergency department due to worsening shortness of breath since the morning. O2 sat initially in the ER was in the 30s. She is on 2 LPM oxygen via nasal cannula at home. The patient is originally from [place] and does not speak english.  Patient in room with no interpreter or family present to provide history. Pt is on Bi-pap. Currently on HH/CHO modified diet. Patient with new diagnosis of diabetes, followed by DM coordinator.  Per MST screen, pt reported poor appetite for 1 day PTA.  Per weight history, pt has gained weight since November 2016.  Ensure supplements have been ordered.  RD to follow-up at a later time to speak with family.  Medications: IV Lasix every 12 hours  Labs reviewed: CBGs: 164-200  Diet Order:  Diet heart healthy/carb modified Room service appropriate?: Yes; Fluid consistency:: Thin  Skin:  Reviewed, no issues  Last BM:  4/23  Height:   Ht Readings from Last 1 Encounters:  05/03/2015 5' (1.524 m)    Weight:   Wt Readings from Last 1 Encounters:  05/14/15 168 lb 10.4 oz (76.5 kg)    Ideal Body Weight:  45.5 kg  BMI:  Body mass index is 32.94 kg/(m^2).  Estimated Nutritional Needs:   Kcal:  1400-1600  Protein:   60-70g  Fluid:  1.6L/day  EDUCATION NEEDS:   No education needs identified at this time  Clayton Bibles, MS, RD, LDN Pager: [phone] After Hours Pager: [phone]

## 2015-05-16 NOTE — Progress Notes (Signed)
Mask secure. No breakdown noted on face.

## 2015-05-16 NOTE — Progress Notes (Signed)
[facility] PROGRESS NOTE  Hayley Lewis [id number] DOB: 01/10/1925 DOA: 05/14/2015 PCP: No primary care provider on file.  Assessment/Plan: 1-acute on chronic resp failure: secondary to HCAP and vascular congestion/mild pulm edema -continue weaning oxygen supplementation as tolerated -2-D echo suggesting component of diastolic heart failure; no wall motion abnormalities and preserved EF. -continue nebulizer and current antibiotics -patient clinically improved; but experience WOB overnight and required to be back on BIPAP -continue IV lasix and antibiotics for PNA -will follow daily weights and intake/output -pulmonary service consulted.  2-sepsis: due to HCAP -sepsis features present on admission -slowly improving/resolving  -will continue cefepime; given neg MRSA swab I have discontinued vancomycin on 4/21 -follow culture data -follow clinical response -continue flutter valve and weaned oxygen as toelrated   3-essential HTN: -given sepsis and need for lasix, will continue holding norvasc for now -BP is stable  4-adenocarcinoma of the Colon: with metastasis -continue outpatient follow up with oncology service (At [facility]) -Per Son info today has achieved remission status  5-Type 2 Diabetes with hyperglycemia -A1C 8.8 -no prior hx of diabetes -will continue SSI and low dose lantus while inpatient -at discharge will favor for oral hypoglycemic regimen   6-obesity: Body mass index is 32.94 kg/(m^2). Low calorie diet discussed   7-acute on chronic diastolic heart failure: preserved EF -continue daily weights and strict I's and O's -might need chronic diuretic regimen to control volume  -continue current dose of lasix -continue to have good urine output and extra fluid signs pretty much resolved on physical exam   8-poor appetite: moderate protein calorie malnutrition  -will use ensure TID -nutrition service consulted, will follow  rec's  9-hypokalemia: -repleted; K 4.7 -will replete as needed -due to use of diuretics   Code Status: Full Family Communication: Son at bedside [name]) and Grandson/grand-nephew at bedside Disposition Plan: remains in stepdown bed; continue weaning oxygen as tolerated; (baseline is 2-3L [place] chronically); continue IV antibiotics and supportive care. Continue pulmicort, IV lasix and flutter valve as tolerated. Will repeat CXR and follow rec's from pulmonary service    Consultants:  Pulmonary service (Dr. Lamonte Sakai)  Procedures:  See below for x-ray reports   2-D Echo - Left ventricle: The cavity size was normal. Wall thickness was  increased in a pattern of mild LVH. Systolic function was normal.  The estimated ejection fraction was in the range of 60% to 65%.  Wall motion was normal; there were no regional wall motion  abnormalities. Doppler parameters are consistent with abnormal  left ventricular relaxation (grade 1 diastolic dysfunction). - Ventricular septum: D-shaped interventricular septum suggesting  RV pressure/volume overload. - Aortic valve: There was no stenosis. - Mitral valve: Mildly calcified annulus. Mildly calcified leaflets  . There was trivial regurgitation. - Left atrium: The atrium was moderately dilated. - Right ventricle: The cavity size was mildly dilated. Systolic  function was normal. - Right atrium: The atrium was moderately dilated. - Tricuspid valve: Peak RV-RA gradient (S): 59 mm Hg. - Pulmonary arteries: PA peak pressure: 74 mm Hg (S). - Systemic veins: IVC measured 2.4 cm with < 50% respirpophasic  variation, suggesting RA pressure 15 mmHg.  Impressions: - Normal LV size with mild LV hypertrophy. EF 60-65%. Mildly  dilated RV with normal systolic function. D-shaped  interventricular septum suggestive of RV pressure/volume  overload. Moderate biatrial enlargement. Severe pulmonary  hypertension.  Antibiotics:  Vancomycin  4/19>>>4/21  Cefepime 4/19  HPI/Subjective: No CP, No fever. Overnight with episode of WOB and desaturation that make her  required BIPAP again.  Objective: Filed Vitals:   05/16/15 0600 05/16/15 0758  BP: 125/64 144/77  Pulse: 56 65  Temp: 98.2 F (36.8 C) 98.1 F (36.7 C)  Resp: 23 17    Intake/Output Summary (Last 24 hours) at 05/16/15 0900 Last data filed at 05/16/15 0600  Gross per 24 hour  Intake    290 ml  Output   2245 ml  Net  -1955 ml   Filed Weights   05/03/2015 2310 05/14/15 0450  Weight: 75.5 kg (166 lb 7.2 oz) 76.5 kg (168 lb 10.4 oz)    Exam:   General: currently afebrile, denying CP currently. Overnight with episode of work of breathing and desaturation, required use of BIPAP again  Cardiovascular: S1 and S2, no rubs or gallops; no JVD appreciated on exam  Respiratory: slight exp wheezing, no frank crackles; decrease BS at bases. Positive rhonchi; No using accessory muscles and tolerating BIPAP  Abdomen: soft, NT, ND, positive BS  Musculoskeletal: trace edema bilaterally; no cyanosis    Data Reviewed: Basic Metabolic Panel:  Recent Labs Lab 05/12/15 0247 05/13/15 0310 05/14/15 0324 05/15/15 0323 05/16/15 0318  NA 137 140 139 143 144  K 4.6 4.4 3.8 3.3* 4.7  CL 106 106 104 99* 99*  CO2 23 27 30  34* 37*  GLUCOSE 318* 156* 107* 96 253*  BUN 21* 29* 21* 19 23*  CREATININE 0.69 0.63 0.60 0.72 0.62  CALCIUM 7.8* 7.7* 7.5* 7.7* 8.3*   Liver Function Tests:  Recent Labs Lab 05/05/2015 1944 05/12/15 0247 05/13/15 0310 05/14/15 0324  AST 43* 32 47* 42*  ALT 32 33 42 55*  ALKPHOS 96 84 71 71  BILITOT 0.7 0.7 0.3 0.7  PROT 8.2* 6.9 6.9 7.1  ALBUMIN 3.7 3.1* 3.2* 3.3*   CBC:  Recent Labs Lab 05/09/2015 1944 05/12/15 0247 05/13/15 0310 05/14/15 0324  WBC 12.7* 12.5* 14.1* 12.1*  NEUTROABS 10.0* 11.3* 10.5* 9.0*  HGB 15.5* 15.0 14.1 14.0  HCT 48.4* 46.2* 46.3* 46.0  MCV 85.4 85.1 87.9 87.8  PLT 362 342 333 322   BNP (last 3  results)  Recent Labs  12/20/14 1532 05/20/2015 1944  BNP 30.2 53.4   CBG:  Recent Labs Lab 05/15/15 0742 05/15/15 1126 05/15/15 1607 05/15/15 2221 05/16/15 0712  GLUCAP 81 235* 176* 261* 200*    Recent Results (from the past 240 hour(s))  Blood Culture (routine x 2)     Status: None (Preliminary result)   Collection Time: 05/04/2015  7:40 PM  Result Value Ref Range Status   Specimen Description BLOOD LEFT WRIST  Final   Special Requests BOTTLES DRAWN AEROBIC AND ANAEROBIC 5CC  Final   Culture   Final    NO GROWTH 3 DAYS Performed at [facility]    Report Status PENDING  Incomplete  Blood Culture (routine x 2)     Status: None (Preliminary result)   Collection Time: 04/23/2015  7:45 PM  Result Value Ref Range Status   Specimen Description BLOOD RIGHT AC  Final   Special Requests BOTTLES DRAWN AEROBIC AND ANAEROBIC 5CC  Final   Culture   Final    NO GROWTH 3 DAYS Performed at [facility]    Report Status PENDING  Incomplete  Urine culture     Status: None   Collection Time: 05/14/2015 11:31 PM  Result Value Ref Range Status   Specimen Description URINE, CLEAN CATCH  Final   Special Requests NONE  Final   Culture  Final    NO GROWTH 1 DAY Performed at [facility]    Report Status 05/13/2015 FINAL  Final  MRSA PCR Screening     Status: None   Collection Time: 05/12/15  9:41 AM  Result Value Ref Range Status   MRSA by PCR NEGATIVE NEGATIVE Final    Comment:        The GeneXpert MRSA Assay (FDA approved for NASAL specimens only), is one component of a comprehensive MRSA colonization surveillance program. It is not intended to diagnose MRSA infection nor to guide or monitor treatment for MRSA infections.      Studies: No results found.  Scheduled Meds: . amLODipine  5 mg Oral Daily  . antiseptic oral rinse  7 mL Mouth Rinse q12n4p  . budesonide (PULMICORT) nebulizer solution  0.25 mg Nebulization BID  . ceFEPime (MAXIPIME) IV  2 g  Intravenous Q24H  . chlorhexidine  15 mL Mouth Rinse BID  . enoxaparin (LOVENOX) injection  40 mg Subcutaneous QHS  . feeding supplement (ENSURE ENLIVE)  237 mL Oral TID BM  . furosemide  30 mg Intravenous Q12H  . insulin aspart  0-9 Units Subcutaneous TID WC  . insulin glargine  10 Units Subcutaneous QHS  . methylPREDNISolone (SOLU-MEDROL) injection  40 mg Intravenous Q12H   Continuous Infusions: . albuterol Stopped (05/13/2015 2115)    Principal Problem:   HCAP (healthcare-associated pneumonia) Active Problems:   Acute respiratory failure with hypoxia ([place])   Essential hypertension   Adenocarcinoma of colon ([place])   Sepsis due to pneumonia ([place])    Time spent: 35 minutes (> 50% of time dedicated to face to face examination, updates to family at bedside and coordination of care/discussion with consultants)    [place], [place] [facility] Pager [phone]. If 7PM-7AM, please contact night-coverage at [email or web address], password [facility] 05/16/2015, 9:00 AM  LOS: 5 days

## 2015-05-16 NOTE — Progress Notes (Signed)
Abg results reported to Marni Griffon, NP. No further orders at this time. MD aware of current BIPAP settings

## 2015-05-16 NOTE — Progress Notes (Signed)
Date:  May 16, 2015 Chart reviewed for concurrent status and case management needs. Will continue to follow patient for changes and needs:  bipap Velva Harman, BSN, [place], [place]

## 2015-05-16 NOTE — Progress Notes (Addendum)
Bipap mask secure. No breakdown noted on face. Neb tx done. Bipap 22/6, 60% changes due to low VT. MD aware

## 2015-05-16 NOTE — Consult Note (Addendum)
Name: Hayley Lewis MRN: [id number] DOB: 1925/01/09    ADMISSION DATE:  05/10/2015 CONSULTATION DATE:  4/24  REFERRING MD :  Dyann Kief   CHIEF COMPLAINT:  Worsening respiratory failure & inability to wean oxygen   BRIEF PATIENT DESCRIPTION:  82 year old female admitted on 4/19 w/ acute on chronic hypoxic respiratory failure in setting of bilateral pulmonary infiltrates & pleural effusions of uncertain etiology. PCCM asked to see on 4/24 when still not responding to aggressive diuresis and ABX.   SIGNIFICANT EVENTS   STUDIES:  2D ECHO 4/21: LVH; EF 123456; grade I diastolic dysfxn, CVP estimated at 91mm Hg; Peak PA estimated at 38mmHg;  4/20: MRSA PCR: negative RVP >>> 4/19 U strep and Legionella antigens negative  ABX vanc 4/19>>>4/23 Cefepime 4/19>>>  HISTORY OF PRESENT ILLNESS:   82 year old non-english speaking female fropm [place]. Has sig h/o metastatic Colon cancer s/p surgery and XRT (per recent note 4/4 from [facility] PET scan was now negative and CEAs down). She is chronically on Oxygen @ 2 liters. Presented to the ED on 4/19 w/ cc: 1d h/o progressive shortness of breath. On arrival O2 sats in 30s on room air. Had significant symptomatic relief w/ NIPPV. Admitted w/ working dx of PNA +/- decompensated HF w/ pulmonary edema. Treated w/ BIPAP, abx and lasix. Was weaned off BIPAP as of 4/23 but still required high flow O2 and during the early am hours of 4/24 again required NIPPV d/t increased work of breathing. Her CXR continued to show bilateral airspace disease +/- effusions. She was continued on lasix, abx and supportive care. PCCM was asked to see the am of 4/24 given the concern that the patient has not improved much w/ the above therapies.   PAST MEDICAL HISTORY :   has a past medical history of Hypertension; Shortness of breath dyspnea; Cancer [facility]); and Metastatic colon cancer in female [facility]).  has past surgical history that includes Abdominal surgery and Colon  surgery. Prior to Admission medications   Medication Sig Start Date End Date Taking? Authorizing Provider  amLODipine (NORVASC) 5 MG tablet Take 5 mg by mouth daily.   Yes Historical Provider, MD  Multiple Vitamin (MULTIVITAMIN WITH MINERALS) TABS tablet Take 1 tablet by mouth daily.   Yes Historical Provider, MD   No Known Allergies  FAMILY HISTORY:  family history is not on file. SOCIAL HISTORY:  reports that she quit smoking about 8 years ago. She does not have any smokeless tobacco history on file. She reports that she does not drink alcohol or use illicit drugs.  REVIEW OF SYSTEMS:   Unable d/t language barrier   SUBJECTIVE:   VITAL SIGNS: Temp:  [98.1 F (36.7 C)-100 F (37.8 C)] 98.1 F (36.7 C) (04/24 0900) Pulse Rate:  [53-103] 62 (04/24 0900) Resp:  [17-32] 18 (04/24 0900) BP: (125-194)/(64-88) 144/77 mmHg (04/24 0758) SpO2:  [85 %-95 %] 95 % (04/24 0900) FiO2 (%):  [55 %-60 %] 60 % (04/24 0820)  Intake/Output Summary (Last 24 hours) at 05/16/15 1128 Last data filed at 05/16/15 0600  Gross per 24 hour  Intake    290 ml  Output   2245 ml  Net  -1955 ml   PHYSICAL EXAMINATION: General:  Chronically ill appearing female. Now on BIPAP Neuro:  Awake, moves all ext. She does not speak english so not sure about orientation  HEENT:  BIPAP mask in place No  JVD  Cardiovascular:  rrr w/our MRG Lungs:  Decreased t/o. + accessory  muscle use  Abdomen:  Soft, not tender + bowel sounds  Musculoskeletal:  Equal st and bulk  Skin:  Dry, intact. Warm  ABG    Component Value Date/Time   PHART 7.430 05/16/2015 1050   PCO2ART 59.5* 05/16/2015 1050   PO2ART 68.5* 05/16/2015 1050   HCO3 38.8* 05/16/2015 1050   TCO2 33.6 05/16/2015 1050   ACIDBASEDEF 2.8* 05/12/2015 0600   O2SAT 93.5 05/16/2015 1050     Recent Labs Lab 05/14/15 0324 05/15/15 0323 05/16/15 0318  NA 139 143 144  K 3.8 3.3* 4.7  CL 104 99* 99*  CO2 30 34* 37*  BUN 21* 19 23*  CREATININE 0.60 0.72  0.62  GLUCOSE 107* 96 253*    Recent Labs Lab 05/12/15 0247 05/13/15 0310 05/14/15 0324  HGB 15.0 14.1 14.0  HCT 46.2* 46.3* 46.0  WBC 12.5* 14.1* 12.1*  PLT 342 333 322   Dg Chest Port 1 View  05/16/2015  CLINICAL DATA:  Shortness of breath. EXAM: PORTABLE CHEST 1 VIEW COMPARISON:  05/13/2015 . FINDINGS: Stable cardiomegaly. Stable bilateral basilar pulmonary infiltrates and/or edema. Stable small bilateral pleural effusions. No pneumothorax . No acute bony abnormality. IMPRESSION: 1. Stable cardiomegaly. 2. Persistent unchanged bilateral basilar pulmonary infiltrates and/or pulmonary edema. Persistent bilateral pleural effusions. Electronically Signed   By: Marcello Moores  Register   On: 05/16/2015 09:44  Persistent bilateral airspace disease w/ pleural effusions.   ASSESSMENT / PLAN:  Acute On Chronic Hypoxic and Hypercarbic Respiratory failure in the setting of bilateral Pulmonary infiltrates of uncertain etiology  Bilateral Pleural effusions Decompensated Acute on Chronic diastolic HF Cor Pulmonale  Severe Secondary PAH Possible PNA  Discussion She has acute on Chronic respiratory failure in the setting of what seems to be decompensated Cor Pulmonale and decompensated acute on chronic diastolic dysfxn w/ resultant pulmonary edema and bilateral pleural effusions. She has significant secondary PAH suggested by her ECHO. Not convinced that she was ever infected. Also not clear why she is Chronically O2 dependent. Does not seem to have the body habitus for OHS or OSA but would be a consideration. There is no h/o lung disease in review of her charts at [facility]. Her language barrier and element of what is reported as anxiety is adding an additional barrier to her care.   Plan Cont NIPPV Cont aggressive Diuresis Cont current abx Send PCT No role for steroids at this time Consider auto-immune workup to further characterize her PAH F/u abg this am... She appears to be fatigued and Concerned  that she is failing NIPPV. Unclear that at age 32 she is a good candidate for invasive MV CT chest to better describe CXR findings Needs diagnostic/therapeutic thoracentesis but need to have family present for this given language barrier She appears very high risk for intubation-->if so would get BAL.   HTN w/ Diastolic dysfxn Gd I Plan Cont current rx  H/o adenocarcinona of colon. Currently in remission  Plan F/[facility] heme[facility] at [facility]  DM type 2 Plan Cont ssi    Erick Colace ACNP-BC [place] Pager # [phone] OR # [phone] if no answer 05/16/2015, 11:28 AM   Attending Note:  I have examined patient, reviewed labs, studies and notes. I have discussed the case with Jerrye Bushy, and I agree with the data and plans as amended above. She has acute on chronic hypoxemic resp failure with B infiltrates and effusion of unclear chronicity. Also with pulmonary HTN, presumed secondary PAH. Consider other possible contributors to her [facility]  such as connective tissue disease - no reported evidence to support this. ? Whether she was exposed to schistosomiasis at some point, which can certainly cause PAH and which is prevalent in [place]. Certainly her PAH is decompensated w evidence R heart dysfxn. On my eval she is in no resp distress but is requiring BiPAP. Poor air movement with decreased basilar breath sounds. Difficult to communicate with her due to language barrier, but she can relate that her R shoulder hurts. I agree with continued efforts at diuresis. Continue to support her with BiPAP. Check CT chest. There may a role for therapeutic and diagnostic thoracentesis at some point. Importantly, I suspect that she would do poorly, would have a low probability of successful extubation should she require mechanical ventilation. For this reason I believe we should discuss limitations on her care with her family.  If she does get intubated then we will get culture data via bronchoscopy.    Independent critical care time is 45 minutes.   Baltazar Apo, MD, PhD 05/16/2015, 12:12 PM [facility] [phone] or if no answer [phone]

## 2015-05-16 NOTE — Progress Notes (Signed)
Patient removing mask and speaking rapidly. Patients family returned to bedside and stated patient said she was very hungry. Patient is now more alert than this morning. Patient trialed on Venti mask and mentating well per family and O2 saturation remaining in 90s. Once patient remained stable on mask patient swallowed small sips of water without complication. Respiratory notified and will assess. RN to continue to monitor and notify MD.

## 2015-05-16 NOTE — Progress Notes (Signed)
Inpatient Diabetes Program Recommendations  AACE/ADA: New Consensus Statement on Inpatient Glycemic Control (2015)  Target Ranges:  Prepandial:   less than 140 mg/dL      Peak postprandial:   less than 180 mg/dL (1-2 hours)      Critically ill patients:  140 - 180 mg/dL   Review of Glycemic Control  Results for [name], [name] (MRN [id number]) as of 05/16/2015 12:41  Ref. Range 05/15/2015 11:26 05/15/2015 16:07 05/15/2015 22:21 05/16/2015 07:12 05/16/2015 11:50  Glucose-Capillary Latest Ref Range: 65-99 mg/dL 235 (H) 176 (H) 261 (H) 200 (H) 164 (H)   FBS elevated.  Inpatient Diabetes Program Recommendations:    Increase Lantus to 14 units QHS.  Will continue to follow. Thank you. Lorenda Peck, RD, LDN, CDE Inpatient Diabetes Coordinator [phone]

## 2015-05-16 NOTE — Progress Notes (Signed)
PT Cancellation Note  Patient Details Name: Hayley Lewis MRN: [id number] DOB: 07/06/1924   Cancelled Treatment:    Reason Eval/Treat Not Completed: Medical issues which prohibited therapy. Pt currently on BIPAP. Will hold PT at this time and check back another time/day. Thanks.    Weston Anna, MPT Pager: [phone]

## 2015-05-17 DIAGNOSIS — I2609 Other pulmonary embolism with acute cor pulmonale: Secondary | ICD-10-CM

## 2015-05-17 LAB — GLUCOSE, CAPILLARY
GLUCOSE-CAPILLARY: 184 mg/dL — AB (ref 65–99)
Glucose-Capillary: 107 mg/dL — ABNORMAL HIGH (ref 65–99)
Glucose-Capillary: 223 mg/dL — ABNORMAL HIGH (ref 65–99)
Glucose-Capillary: 100 mg/dL — ABNORMAL HIGH (ref 65–99)
Glucose-Capillary: 114 mg/dL — ABNORMAL HIGH (ref 65–99)

## 2015-05-17 LAB — BASIC METABOLIC PANEL
Anion gap: 9 (ref 5–15)
BUN: 26 mg/dL — AB (ref 6–20)
CO2: 37 mmol/L — ABNORMAL HIGH (ref 22–32)
CREATININE: 0.56 mg/dL (ref 0.44–1.00)
Calcium: 8.7 mg/dL — ABNORMAL LOW (ref 8.9–10.3)
Chloride: 100 mmol/L — ABNORMAL LOW (ref 101–111)
GFR calc Af Amer: >60 mL/min (ref >60)
Glucose, Bld: 120 mg/dL — ABNORMAL HIGH (ref 65–99)
POTASSIUM: 3.9 mmol/L (ref 3.5–5.1)
SODIUM: 146 mmol/L — AB (ref 135–145)

## 2015-05-17 LAB — CBC
HCT: 46.5 % — ABNORMAL HIGH (ref 36.0–46.0)
Hemoglobin: 14.4 g/dL (ref 12.0–15.0)
MCH: 26.6 pg (ref 26.0–34.0)
MCHC: 31 g/dL (ref 30.0–36.0)
MCV: 85.8 fL (ref 78.0–100.0)
PLATELETS: 348 10*3/uL (ref 150–400)
RBC: 5.42 MIL/uL — AB (ref 3.87–5.11)
RDW: 14.9 % (ref 11.5–15.5)
WBC: 14.3 10*3/uL — ABNORMAL HIGH (ref 4.0–10.5)

## 2015-05-17 LAB — CULTURE, BLOOD (ROUTINE X 2)
CULTURE: NO GROWTH
CULTURE: NO GROWTH

## 2015-05-17 MED ORDER — OXYCODONE HCL 5 MG PO TABS
5.0000 mg | ORAL_TABLET | Freq: Once | ORAL | Status: AC
  Administered 2015-05-17: 5 mg via ORAL
  Filled 2015-05-17: qty 1

## 2015-05-17 MED ORDER — FUROSEMIDE 10 MG/ML IJ SOLN
20.0000 mg | Freq: Two times a day (BID) | INTRAMUSCULAR | Status: DC
  Administered 2015-05-18 – 2015-05-23 (×11): 20 mg via INTRAVENOUS
  Filled 2015-05-17 (×11): qty 2

## 2015-05-17 MED ORDER — TRAMADOL HCL 50 MG PO TABS
50.0000 mg | ORAL_TABLET | Freq: Two times a day (BID) | ORAL | Status: DC | PRN
  Administered 2015-05-17 – 2015-05-18 (×2): 50 mg via ORAL
  Filled 2015-05-17 (×2): qty 1

## 2015-05-17 NOTE — Progress Notes (Signed)
Name: Hayley Lewis MRN: [id number] DOB: [birth year]    ADMISSION DATE:  05/10/2015 CONSULTATION DATE:  4/24  REFERRING MD :  Dyann Kief   CHIEF COMPLAINT:  Worsening respiratory failure & inability to wean oxygen   BRIEF PATIENT DESCRIPTION:  82 year old female admitted on 4/19 w/ acute on chronic hypoxic respiratory failure in setting of bilateral pulmonary infiltrates & pleural effusions of uncertain etiology. PCCM asked to see on 4/24 when still not responding to aggressive diuresis and ABX.   SIGNIFICANT EVENTS   STUDIES:  2D ECHO 4/21: LVH; EF 123456; grade I diastolic dysfxn, CVP estimated at 12mm Hg; Peak PA estimated at 60mmHg;  4/20: MRSA PCR: negative RVP >>> 4/19 U strep and Legionella antigens negative  ABX vanc 4/19>>>4/23 Cefepime 4/19>>>   SUBJECTIVE:  Confused   VITAL SIGNS: Temp:  [98.1 F (36.7 C)-99 F (37.2 C)] 99 F (37.2 C) (04/25 0813) Pulse Rate:  [55-94] 66 (04/25 0813) Resp:  [13-26] 16 (04/25 0813) BP: (118-173)/(47-84) 127/53 mmHg (04/25 0813) SpO2:  [84 %-96 %] 91 % (04/25 0813) FiO2 (%):  [60 %] 60 % (04/25 0813)  Intake/Output Summary (Last 24 hours) at 05/17/15 0932 Last data filed at 05/17/15 0600  Gross per 24 hour  Intake     50 ml  Output   1525 ml  Net  -1475 ml   PHYSICAL EXAMINATION: General:  Chronically ill appearing female. Now on BIPAP Neuro:  Awake, moves all ext. She does not speak english so not sure about orientation  HEENT:  BIPAP mask in place No  JVD  Cardiovascular:  rrr w/our MRG Lungs:  Decreased t/o. + accessory muscle use  Abdomen:  Soft, not tender + bowel sounds  Musculoskeletal:  Equal st and bulk  Skin:  Dry, intact. Warm  ABG    Component Value Date/Time   PHART 7.430 05/16/2015 1050   PCO2ART 59.5* 05/16/2015 1050   PO2ART 68.5* 05/16/2015 1050   HCO3 38.8* 05/16/2015 1050   TCO2 33.6 05/16/2015 1050   ACIDBASEDEF 2.8* 05/12/2015 0600   O2SAT 93.5 05/16/2015 1050     Recent Labs Lab  05/15/15 0323 05/16/15 0318 05/17/15 0333  NA 143 144 146*  K 3.3* 4.7 3.9  CL 99* 99* 100*  CO2 34* 37* 37*  BUN 19 23* 26*  CREATININE 0.72 0.62 0.56  GLUCOSE 96 253* 120*    Recent Labs Lab 05/13/15 0310 05/14/15 0324 05/17/15 0333  HGB 14.1 14.0 14.4  HCT 46.3* 46.0 46.5*  WBC 14.1* 12.1* 14.3*  PLT 333 322 348   Ct Chest Wo Contrast  05/17/2015  CLINICAL DATA:  Acute on chronic respiratory failure. Metastatic colon cancer. EXAM: CT CHEST WITHOUT CONTRAST TECHNIQUE: Multidetector CT imaging of the chest was performed following the standard protocol without IV contrast. COMPARISON:  None. FINDINGS: THORACIC INLET/BODY WALL: No acute abnormality. MEDIASTINUM: Chronic cardiomegaly. No pericardial effusion. Atherosclerosis as expected for age. No adenopathy detected. LUNG WINDOWS: Small borderline moderate pleural effusion with multi segment lower and right middle lobe atelectasis. Mild patchy density in the apical lungs could also be atelectasis. No convincing pneumonia. Occasional septal thickening without definitive edema. No visible metastatic disease. UPPER ABDOMEN: No acute findings. Stable appearance of subcapsular low-density abnormality in the central liver with possible central mineralization. OSSEOUS: Chronic appearing thoracic endplate concavities.  No acute finding. IMPRESSION: Small to moderate pleural effusions with multi segment atelectasis. Electronically Signed   By: Monte Fantasia M.D.   On: 05/17/2015 02:42   Dg  Chest Port 1 View  05/16/2015  CLINICAL DATA:  Shortness of breath. EXAM: PORTABLE CHEST 1 VIEW COMPARISON:  05/13/2015 . FINDINGS: Stable cardiomegaly. Stable bilateral basilar pulmonary infiltrates and/or edema. Stable small bilateral pleural effusions. No pneumothorax . No acute bony abnormality. IMPRESSION: 1. Stable cardiomegaly. 2. Persistent unchanged bilateral basilar pulmonary infiltrates and/or pulmonary edema. Persistent bilateral pleural effusions.  Electronically Signed   By: Marcello Moores  Register   On: 05/16/2015 09:44  Persistent bilateral airspace disease w/ pleural effusions.   ASSESSMENT / PLAN:  Acute On Chronic Hypoxic and Hypercarbic Respiratory failure in the setting of Decompensated Pulmonary artery hypertension. ? Exposure to Schistosomiasis in the past.   Decompensated Acute on Chronic diastolic HF Cor Pulmonale  Possible PNA  Discussion She has acute on Chronic respiratory failure in the setting of what seems to be decompensated Cor Pulmonale and decompensated acute on chronic diastolic dysfxn w/ resultant pulmonary edema and bilateral pleural effusions. She has significant secondary PAH suggested by her ECHO. Not convinced that she was ever infected. Do think that the [facility] is driving her respiratory failure and this is not likely to be something reversible at age 57. I have spoken at length w/ the son and his wife. Have recommended hospice and DNR status.   Plan Cont NIPPV @ HS. Will likely need Cont aggressive Diuresis Cont current abx-->consider 10d stop date  No role for steroids at this time She remains risk for intubation-->if so would get BAL.  Have highly recommended DNR status and think she is hospice appropriate.   HTN w/ Diastolic dysfxn Gd I Plan Cont current rx  H/o adenocarcinona of colon. Currently in remission  Plan F/u heme[facility]c at [facility]  DM type 2 Plan Cont ssi    Erick Colace ACNP-BC [place] Pager # [phone] OR # [phone] if no answer 05/17/2015, 9:32 AM   Attending Note:  I have examined patient, reviewed labs, studies and notes. I have discussed the case with Jerrye Bushy, and I agree with the data and plans as amended above. 82yo woman with severe PAH and now evidence for cor pulmonale with acute on chronic hypoxemic resp failure. Unclear that a formal workup of her PAH (for example auto-immune labs) or targeted therapy for PAH would be beneficial at her age. I do  Not  see any ILD on her CT chest to support connective tissue disease (although this can cause PAh in absence ILD). She has benefited some from diuresis and would continue this as she can tolerate. Would use biPAP prn for dyspnea as you work to get her into better volume balance. We have initiated discussions with family regarding aggressiveness of care, prognosis and code status. I recommend conservative care as we have outlined, DNR status. She would be appropriate for hospice referral given the severity (and presumed chronicity) of her PAH.  Independent critical care time is 40 minutes.   Baltazar Apo, MD, PhD 05/17/2015, 10:23 AM [facility] [phone] or if no answer [phone]

## 2015-05-17 NOTE — Progress Notes (Signed)
[facility] PROGRESS NOTE  Hayley Lewis [id number] DOB: [birth year] DOA: 04/27/2015 PCP: No primary care provider on file.  Interim summary and H&P 82 year old female admitted on 4/19 w/ acute on chronic hypoxic respiratory failure in setting of bilateral pulmonary infiltrates & pleural effusions of uncertain etiology. Patient treated for CHF exacerbation and PNA. Course complicated by prolonged needs of BIPAP and needs for high flow oxygen support. PCCM is on board assisting with treatment. High concerns for PAH.  Assessment/Plan: 1-acute on chronic resp failure: secondary to HCAP and vascular congestion/mild pulm edema -continue weaning oxygen supplementation as tolerated -2-D echo suggesting component of diastolic heart failure; no wall motion abnormalities and preserved EF. Also with PAH -continue nebulizer and current antibiotics -patient clinically improving slowly again. But requiring high flow oxygen supplementation  -continue IV lasix and antibiotics for PNA -will follow daily weights and intake/output -pulmonary service consulted; will follow rec's.  2-sepsis: due to HCAP -sepsis features present on admission -slowly improving/resolving  -will continue cefepime; given neg MRSA swab I have discontinued vancomycin on 4/21 -follow culture data -follow clinical response -continue flutter valve and weaned oxygen as toelrated  -plan is to complete tx with 10 days total of antibiotics (las dose 05/21/15)  3-essential HTN: -given sepsis and need for lasix, will continue holding norvasc for now -BP is stable with current regimen of diuretics  4-adenocarcinoma of the Colon: with metastasis -continue outpatient follow up with oncology service (At [facility]) -Per Son info, has achieved remission status  5-Type 2 Diabetes with hyperglycemia -A1C 8.8 -no prior hx of diabetes -will continue SSI and low dose lantus while inpatient -at discharge will favor for oral  hypoglycemic regimen   6-obesity: Body mass index is 32.94 kg/(m^2). Low calorie diet discussed   7-acute on chronic diastolic heart failure: preserved EF -continue daily weights and strict I's and O's -might need chronic diuretic regimen to control volume at discharge -continue IV lasix -continue to have good urine output and extra fluid signs resolving -with concerns for PAH; pulmonary service on board, will follow rec's   8-poor appetite: moderate protein calorie malnutrition  -will use ensure TID -nutrition service consulted, will follow rec's  9-hypokalemia: -will replete as needed -due to use of diuretics   Code Status: Full Family Communication: Son at bedside Hayley Lewis) and Grandson/grand-nephew at bedside Disposition Plan: remains in stepdown bed; continue weaning oxygen as tolerated; (baseline is 2-3L [place] chronically); continue IV antibiotics (to complete total of 10 days) and continue supportive care. Continue pulmicort, IV lasix and flutter valve as tolerated. Will follow rec's from pulmonary service    Consultants:  Pulmonary service (Dr. Lamonte Sakai)  Procedures:  See below for x-ray reports   2-D Echo - Left ventricle: The cavity size was normal. Wall thickness was  increased in a pattern of mild LVH. Systolic function was normal.  The estimated ejection fraction was in the range of 60% to 65%.  Wall motion was normal; there were no regional wall motion  abnormalities. Doppler parameters are consistent with abnormal  left ventricular relaxation (grade 1 diastolic dysfunction). - Ventricular septum: D-shaped interventricular septum suggesting  RV pressure/volume overload. - Aortic valve: There was no stenosis. - Mitral valve: Mildly calcified annulus. Mildly calcified leaflets  . There was trivial regurgitation. - Left atrium: The atrium was moderately dilated. - Right ventricle: The cavity size was mildly dilated. Systolic  function was normal. - Right  atrium: The atrium was moderately dilated. - Tricuspid valve: Peak RV-RA gradient (S): 59  mm Hg. - Pulmonary arteries: PA peak pressure: 74 mm Hg (S). - Systemic veins: IVC measured 2.4 cm with < 50% respirpophasic  variation, suggesting RA pressure 15 mmHg.  Impressions: - Normal LV size with mild LV hypertrophy. EF 60-65%. Mildly  dilated RV with normal systolic function. D-shaped  interventricular septum suggestive of RV pressure/volume  overload. Moderate biatrial enlargement. Severe pulmonary  hypertension.  Antibiotics:  Vancomycin 4/19>>>4/21  Cefepime 4/19>>> 4/29 (with plans to stop in 4 more days)  HPI/Subjective: No CP, No fever. Breathing better and feeling better. Currently off BIPAP and expressing being hungry.   Objective: Filed Vitals:   05/17/15 1233 05/17/15 1403  BP: 145/64 132/51  Pulse: 92 86  Temp: 98.8 F (37.1 C) 99 F (37.2 C)  Resp: 28 27    Intake/Output Summary (Last 24 hours) at 05/17/15 1536 Last data filed at 05/17/15 1403  Gross per 24 hour  Intake     50 ml  Output   1275 ml  Net  -1225 ml   Filed Weights   05/03/2015 2310 05/14/15 0450  Weight: 75.5 kg (166 lb 7.2 oz) 76.5 kg (168 lb 10.4 oz)    Exam:   General: patient is feeling some what better and is afebrile. Able to be weaned off BIPAP and is on non-rebreather. Currently afebrile and with stable VS  Cardiovascular: S1 and S2, no rubs or gallops; no JVD appreciated on exam  Respiratory: slight exp wheezing, no frank crackles; decrease BS at bases. Positive rhonchi; No using accessory muscles and tolerating non-rebreathe mask  Abdomen: soft, NT, ND, positive BS  Musculoskeletal: trace edema bilaterally; no cyanosis    Data Reviewed: Basic Metabolic Panel:  Recent Labs Lab 05/13/15 0310 05/14/15 0324 05/15/15 0323 05/16/15 0318 05/17/15 0333  NA 140 139 143 144 146*  K 4.4 3.8 3.3* 4.7 3.9  CL 106 104 99* 99* 100*  CO2 27 30 34* 37* 37*  GLUCOSE 156*  107* 96 253* 120*  BUN 29* 21* 19 23* 26*  CREATININE 0.63 0.60 0.72 0.62 0.56  CALCIUM 7.7* 7.5* 7.7* 8.3* 8.7*   Liver Function Tests:  Recent Labs Lab 05/16/2015 1944 05/12/15 0247 05/13/15 0310 05/14/15 0324  AST 43* 32 47* 42*  ALT 32 33 42 55*  ALKPHOS 96 84 71 71  BILITOT 0.7 0.7 0.3 0.7  PROT 8.2* 6.9 6.9 7.1  ALBUMIN 3.7 3.1* 3.2* 3.3*   CBC:  Recent Labs Lab 05/13/2015 1944 05/12/15 0247 05/13/15 0310 05/14/15 0324 05/17/15 0333  WBC 12.7* 12.5* 14.1* 12.1* 14.3*  NEUTROABS 10.0* 11.3* 10.5* 9.0*  --   HGB 15.5* 15.0 14.1 14.0 14.4  HCT 48.4* 46.2* 46.3* 46.0 46.5*  MCV 85.4 85.1 87.9 87.8 85.8  PLT 362 342 333 322 348   BNP (last 3 results)  Recent Labs  12/20/14 1532 05/10/2015 1944  BNP 30.2 53.4   CBG:  Recent Labs Lab 05/16/15 1558 05/16/15 2139 05/17/15 0742 05/17/15 1119 05/17/15 1518  GLUCAP 148* 184* 107* 223* 100*    Recent Results (from the past 240 hour(s))  Blood Culture (routine x 2)     Status: None   Collection Time: 05/22/2015  7:40 PM  Result Value Ref Range Status   Specimen Description BLOOD LEFT WRIST  Final   Special Requests BOTTLES DRAWN AEROBIC AND ANAEROBIC 5CC  Final   Culture   Final    NO GROWTH 5 DAYS Performed at [facility]    Report Status 05/17/2015 FINAL  Final  Blood Culture (routine x 2)     Status: None   Collection Time: 05/03/2015  7:45 PM  Result Value Ref Range Status   Specimen Description BLOOD RIGHT AC  Final   Special Requests BOTTLES DRAWN AEROBIC AND ANAEROBIC 5CC  Final   Culture   Final    NO GROWTH 5 DAYS Performed at [facility]    Report Status 05/17/2015 FINAL  Final  Urine culture     Status: None   Collection Time: 04/23/2015 11:31 PM  Result Value Ref Range Status   Specimen Description URINE, CLEAN CATCH  Final   Special Requests NONE  Final   Culture   Final    NO GROWTH 1 DAY Performed at [facility]    Report Status 05/13/2015 FINAL  Final  MRSA PCR  Screening     Status: None   Collection Time: 05/12/15  9:41 AM  Result Value Ref Range Status   MRSA by PCR NEGATIVE NEGATIVE Final    Comment:        The GeneXpert MRSA Assay (FDA approved for NASAL specimens only), is one component of a comprehensive MRSA colonization surveillance program. It is not intended to diagnose MRSA infection nor to guide or monitor treatment for MRSA infections.      Studies: Ct Chest Wo Contrast  05/17/2015  CLINICAL DATA:  Acute on chronic respiratory failure. Metastatic colon cancer. EXAM: CT CHEST WITHOUT CONTRAST TECHNIQUE: Multidetector CT imaging of the chest was performed following the standard protocol without IV contrast. COMPARISON:  None. FINDINGS: THORACIC INLET/BODY WALL: No acute abnormality. MEDIASTINUM: Chronic cardiomegaly. No pericardial effusion. Atherosclerosis as expected for age. No adenopathy detected. LUNG WINDOWS: Small borderline moderate pleural effusion with multi segment lower and right middle lobe atelectasis. Mild patchy density in the apical lungs could also be atelectasis. No convincing pneumonia. Occasional septal thickening without definitive edema. No visible metastatic disease. UPPER ABDOMEN: No acute findings. Stable appearance of subcapsular low-density abnormality in the central liver with possible central mineralization. OSSEOUS: Chronic appearing thoracic endplate concavities.  No acute finding. IMPRESSION: Small to moderate pleural effusions with multi segment atelectasis. Electronically Signed   By: Monte Fantasia M.D.   On: 05/17/2015 02:42   Dg Chest Port 1 View  05/16/2015  CLINICAL DATA:  Shortness of breath. EXAM: PORTABLE CHEST 1 VIEW COMPARISON:  05/13/2015 . FINDINGS: Stable cardiomegaly. Stable bilateral basilar pulmonary infiltrates and/or edema. Stable small bilateral pleural effusions. No pneumothorax . No acute bony abnormality. IMPRESSION: 1. Stable cardiomegaly. 2. Persistent unchanged bilateral basilar  pulmonary infiltrates and/or pulmonary edema. Persistent bilateral pleural effusions. Electronically Signed   By: Marcello Moores  Register   On: 05/16/2015 09:44    Scheduled Meds: . antiseptic oral rinse  7 mL Mouth Rinse q12n4p  . budesonide (PULMICORT) nebulizer solution  0.25 mg Nebulization BID  . ceFEPime (MAXIPIME) IV  2 g Intravenous Q24H  . chlorhexidine  15 mL Mouth Rinse BID  . enoxaparin (LOVENOX) injection  40 mg Subcutaneous QHS  . feeding supplement (ENSURE ENLIVE)  237 mL Oral TID BM  . furosemide  30 mg Intravenous Q12H  . hydrALAZINE  10 mg Intravenous Q6H  . insulin aspart  0-9 Units Subcutaneous TID WC  . insulin glargine  10 Units Subcutaneous QHS   Continuous Infusions: . albuterol Stopped (05/21/2015 2115)    Principal Problem:   HCAP (healthcare-associated pneumonia) Active Problems:   Acute respiratory failure with hypoxia [facility])   Essential hypertension   Adenocarcinoma  of colon ([place])   Sepsis due to pneumonia [facility])    Time spent: 35 minutes (> 50% of time dedicated to face to face examination, updates to family at bedside and coordination of care/discussion with consultants)    [place], [facility] Pager [phone]. If 7PM-7AM, please contact night-coverage at [email or web address], password [email or web address] 05/17/2015, 3:36 PM  LOS: 6 days

## 2015-05-17 NOTE — Progress Notes (Signed)
PT Cancellation Note  Patient Details Name: Hayley Lewis MRN: [id number] DOB: [birth year]   Cancelled Treatment:    Reason Eval/Treat Not Completed: Medical issues which prohibited therapy. Spoke with RN who recommended PT check back another day.    Weston Anna, MPT Pager: [phone]

## 2015-05-18 ENCOUNTER — Inpatient Hospital Stay (HOSPITAL_COMMUNITY): Payer: Medicaid Other

## 2015-05-18 DIAGNOSIS — E440 Moderate protein-calorie malnutrition: Secondary | ICD-10-CM

## 2015-05-18 DIAGNOSIS — I2609 Other pulmonary embolism with acute cor pulmonale: Secondary | ICD-10-CM | POA: Insufficient documentation

## 2015-05-18 DIAGNOSIS — I5033 Acute on chronic diastolic (congestive) heart failure: Secondary | ICD-10-CM

## 2015-05-18 LAB — BASIC METABOLIC PANEL
ANION GAP: 8 (ref 5–15)
BUN: 24 mg/dL — ABNORMAL HIGH (ref 6–20)
CHLORIDE: 93 mmol/L — AB (ref 101–111)
CO2: 40 mmol/L — ABNORMAL HIGH (ref 22–32)
CREATININE: 0.58 mg/dL (ref 0.44–1.00)
Calcium: 8.4 mg/dL — ABNORMAL LOW (ref 8.9–10.3)
GFR calc non Af Amer: >60 mL/min (ref >60)
Glucose, Bld: 118 mg/dL — ABNORMAL HIGH (ref 65–99)
POTASSIUM: 3.6 mmol/L (ref 3.5–5.1)
SODIUM: 141 mmol/L (ref 135–145)

## 2015-05-18 LAB — GLUCOSE, CAPILLARY
GLUCOSE-CAPILLARY: 122 mg/dL — AB (ref 65–99)
GLUCOSE-CAPILLARY: 184 mg/dL — AB (ref 65–99)
Glucose-Capillary: 102 mg/dL — ABNORMAL HIGH (ref 65–99)
Glucose-Capillary: 149 mg/dL — ABNORMAL HIGH (ref 65–99)

## 2015-05-18 NOTE — Progress Notes (Signed)
Spoke with patient's son and gave him Dr. Kirstie Mirza number to call.  Son stated he would call him shortly.

## 2015-05-18 NOTE — Progress Notes (Signed)
Patient oxygen saturation holding at 85-86% on 25L [place], FiO2 80%. Tylene Fantasia paged regarding oxygen saturation. RN ordered to put patient on BiPap and maintain oxygen saturation at 88% or greater. Patient on Bipap at this time, maintaining oxygen saturation at 88%. Will continue to monitor.

## 2015-05-18 NOTE — Progress Notes (Signed)
Patient placed on heated HFNC 25L and currently 80%. Patient O2 saturations 92-94% at this time. Patient comfortable on setup. O2 sat goal of 88% or > per CCM. RT will continue to monitor patient.

## 2015-05-18 NOTE — Progress Notes (Signed)
Palliative Medicine Team consult was received.   We will schedule a meeting with patient and her family at the earliest possible time family can meet and we have a provider available. No family present at bedside at this time.  I attempted to call her son, his voicemail is full. I left a card with bedside nurse and asked her to give to family in order to call to schedule family meeting.  If there are urgent needs or questions please call [phone]. Thank you for consulting out team to assist with this patients care.  Micheline Rough, MD [place] Team [phone]

## 2015-05-18 NOTE — Progress Notes (Addendum)
[facility] PROGRESS NOTE    Progress Note   Hayley Lewis [id number] DOB: 12/23/1924 DOA: 04/27/2015 PCP: No primary care provider on file.   Brief Narrative:   Hayley Lewis is an 82 y.o. female  with medical history significant of hypertension, metastatic colon cancer In November 2016 she underwent microwave ablation of her solitary liver lesion, who was brought to the emergency department due to worsening shortness of breath since the morning, w/ acute on chronic hypoxic respiratory failure in setting of bilateral pulmonary infiltrates & pleural effusions of uncertain etiology. Patient treated for CHF exacerbation and PNA. Course complicated by prolonged needs of BIPAP and needs for high flow oxygen support. PCCM is on board assisting with treatment. High concerns for PAH.  Assessment/Plan:   Acute on chronic respiratory failure secondary to HCAP and acute on chronic diastolic heart failure: - Appreciate PCCM assistance. - Cont NIPPV at bed time. Cont IV Abx for a total of 10 days. - Cont IV lasix, follow stricit I and O's. - Daily weights. - Will have to discuss with family goals of care. Will attempt to call her son again this afternoon. - cosult PMT, I have d/w the family. Daughter-in law will be here on 4.27.2017. She is a physician at [employer].  Sepsis due to pneumonia [facility]): Slowly improving, HR and BP stable. Has not require pressors. Cont IV cefepime, started on 4.19.2017, Vanc discontinue on [id number]. Stopped date for cefepime on 4.29.2017.  Essential hypertension: - Cont to hold norvasc.  Adenocarcinoma of colon Stage IV ([place]) Fhe status post radioablation on nov 2016 with repeat CT scan of the abdomen and pelvis on December 2016 that showed enlarging liver mass. I am concerned that her malignancy is progressing. Check x-ray of right arm to evaluate pain, also check CEA.  Type 2 DM with hyperglycemia: A1c 8.8. Cont Long acting insulin plus  SSI.  Obesity: BMI 32.9  Moderate protein caloric malnutrition:    DVT Prophylaxis - Lovenox ordered.  Family Communication: grandson at bedside Disposition Plan: unable to determine. Code Status:     Code Status Orders        Start     Ordered   05/04/2015 2332  Full code   Continuous     05/14/2015 2331    Code Status History    Date Active Date Inactive Code Status Order ID Comments User Context   12/20/2014  5:08 PM 12/22/2014  6:54 PM Full Code [id number]  [name], MD ED        IV Access:    Peripheral IV   Procedures and diagnostic studies:   Ct Chest Wo Contrast  05-20-2015  CLINICAL DATA:  Acute on chronic respiratory failure. Metastatic colon cancer. EXAM: CT CHEST WITHOUT CONTRAST TECHNIQUE: Multidetector CT imaging of the chest was performed following the standard protocol without IV contrast. COMPARISON:  None. FINDINGS: THORACIC INLET/BODY WALL: No acute abnormality. MEDIASTINUM: Chronic cardiomegaly. No pericardial effusion. Atherosclerosis as expected for age. No adenopathy detected. LUNG WINDOWS: Small borderline moderate pleural effusion with multi segment lower and right middle lobe atelectasis. Mild patchy density in the apical lungs could also be atelectasis. No convincing pneumonia. Occasional septal thickening without definitive edema. No visible metastatic disease. UPPER ABDOMEN: No acute findings. Stable appearance of subcapsular low-density abnormality in the central liver with possible central mineralization. OSSEOUS: Chronic appearing thoracic endplate concavities.  No acute finding. IMPRESSION: Small to moderate pleural effusions with multi segment atelectasis. Electronically Signed   By: Neva Seat.D.  On: 05/17/2015 02:42   Dg Chest Port 1 View  05/16/2015  CLINICAL DATA:  Shortness of breath. EXAM: PORTABLE CHEST 1 VIEW COMPARISON:  05/13/2015 . FINDINGS: Stable cardiomegaly. Stable bilateral basilar pulmonary infiltrates and/or  edema. Stable small bilateral pleural effusions. No pneumothorax . No acute bony abnormality. IMPRESSION: 1. Stable cardiomegaly. 2. Persistent unchanged bilateral basilar pulmonary infiltrates and/or pulmonary edema. Persistent bilateral pleural effusions. Electronically Signed   By: Marcello Moores  Register   On: 05/16/2015 09:44     Medical Consultants:    None.  Anti-Infectives:   Anti-infectives    Start     Dose/Rate Route Frequency Ordered Stop   05/13/15 1400  ceFEPIme (MAXIPIME) 2 g in dextrose 5 % 50 mL IVPB     2 g 100 mL/hr over 30 Minutes Intravenous Every 24 hours 05/13/15 0809     05/12/15 0800  vancomycin (VANCOCIN) IVPB 750 mg/150 ml premix  Status:  Discontinued     750 mg 150 mL/hr over 60 Minutes Intravenous Every 12 hours 05/12/15 0120 05/13/15 0912   05/12/15 0200  ceFEPIme (MAXIPIME) 1 g in dextrose 5 % 50 mL IVPB  Status:  Discontinued     1 g 100 mL/hr over 30 Minutes Intravenous Every 8 hours 05/07/2015 2331 05/12/15 0122   05/12/15 0200  ceFEPIme (MAXIPIME) 1 g in dextrose 5 % 50 mL IVPB  Status:  Discontinued     1 g 100 mL/hr over 30 Minutes Intravenous Every 12 hours 05/12/15 0122 05/13/15 0809   04/29/2015 1945  piperacillin-tazobactam (ZOSYN) IVPB 3.375 g     3.375 g 100 mL/hr over 30 Minutes Intravenous  Once 05/06/2015 1941 05/20/2015 2039   04/30/2015 1945  vancomycin (VANCOCIN) IVPB 1000 mg/200 mL premix     1,000 mg 200 mL/hr over 60 Minutes Intravenous  Once 05/07/2015 1941 04/27/2015 2111      Subjective:    Marnee Spring she relate she cont ot have right arm pain, not eating.  Objective:    Filed Vitals:   05/18/15 0000 05/18/15 0015 05/18/15 0400 05/18/15 0616  BP: 143/51 143/51 126/42 127/61  Pulse: 77  77 80  Temp: 98.8 F (37.1 C) 99 F (37.2 C) 99.1 F (37.3 C) 99.1 F (37.3 C)  TempSrc: Core (Comment) Core (Comment) Core (Comment)   Resp: 20  20 21   Height:      Weight:      SpO2: 90%  92% 93%    Intake/Output Summary (Last 24  hours) at 05/18/15 0707 Last data filed at 05/18/15 0600  Gross per 24 hour  Intake    290 ml  Output   1485 ml  Net  -1195 ml   Filed Weights   04/28/2015 2310 05/14/15 0450  Weight: 75.5 kg (166 lb 7.2 oz) 76.5 kg (168 lb 10.4 oz)    Exam: Gen:  NAD, cachectic Cardiovascular:  RRR, + JVD Chest and lungs:   Good air movement clear to auscultation Abdomen:  Abdomen soft, NT/ND, + BS Extremities:  No lower ext edema   Data Reviewed:    Labs: Basic Metabolic Panel:  Recent Labs Lab 05/14/15 0324 05/15/15 0323 05/16/15 0318 05/17/15 0333 05/18/15 0318  NA 139 143 144 146* 141  K 3.8 3.3* 4.7 3.9 3.6  CL 104 99* 99* 100* 93*  CO2 30 34* 37* 37* 40*  GLUCOSE 107* 96 253* 120* 118*  BUN 21* 19 23* 26* 24*  CREATININE 0.60 0.72 0.62 0.56 0.58  CALCIUM 7.5* 7.7* 8.3* 8.7*  8.4*   GFR Estimated Creatinine Clearance: 44.4 mL/min (by C-G formula based on Cr of 0.58). Liver Function Tests:  Recent Labs Lab 04/27/2015 1944 05/12/15 0247 05/13/15 0310 05/14/15 0324  AST 43* 32 47* 42*  ALT 32 33 42 55*  ALKPHOS 96 84 71 71  BILITOT 0.7 0.7 0.3 0.7  PROT 8.2* 6.9 6.9 7.1  ALBUMIN 3.7 3.1* 3.2* 3.3*   No results for input(s): LIPASE, AMYLASE in the last 168 hours. No results for input(s): AMMONIA in the last 168 hours. Coagulation profile No results for input(s): INR, PROTIME in the last 168 hours.  CBC:  Recent Labs Lab 04/23/2015 1944 05/12/15 0247 05/13/15 0310 05/14/15 0324 05/17/15 0333  WBC 12.7* 12.5* 14.1* 12.1* 14.3*  NEUTROABS 10.0* 11.3* 10.5* 9.0*  --   HGB 15.5* 15.0 14.1 14.0 14.4  HCT 48.4* 46.2* 46.3* 46.0 46.5*  MCV 85.4 85.1 87.9 87.8 85.8  PLT 362 342 333 322 348   Cardiac Enzymes: No results for input(s): CKTOTAL, CKMB, CKMBINDEX, TROPONINI in the last 168 hours. BNP (last 3 results) No results for input(s): PROBNP in the last 8760 hours. CBG:  Recent Labs Lab 05/16/15 2139 05/17/15 0742 05/17/15 1119 05/17/15 1518 05/17/15 2118   GLUCAP 184* 107* 223* 100* 114*   D-Dimer: No results for input(s): DDIMER in the last 72 hours. Hgb A1c: No results for input(s): HGBA1C in the last 72 hours. Lipid Profile: No results for input(s): CHOL, HDL, LDLCALC, TRIG, CHOLHDL, LDLDIRECT in the last 72 hours. Thyroid function studies: No results for input(s): TSH, T4TOTAL, T3FREE, THYROIDAB in the last 72 hours.  Invalid input(s): FREET3 Anemia work up: No results for input(s): VITAMINB12, FOLATE, FERRITIN, TIBC, IRON, RETICCTPCT in the last 72 hours. Sepsis Labs:  Recent Labs Lab 05/12/2015 1952 05/02/2015 2340 05/12/15 0247 05/13/15 0310 05/14/15 0324 05/17/15 0333  WBC  --   --  12.5* 14.1* 12.1* 14.3*  LATICACIDVEN 2.65* 1.7 1.7  --   --   --    Microbiology Recent Results (from the past 240 hour(s))  Blood Culture (routine x 2)     Status: None   Collection Time: 05/16/2015  7:40 PM  Result Value Ref Range Status   Specimen Description BLOOD LEFT WRIST  Final   Special Requests BOTTLES DRAWN AEROBIC AND ANAEROBIC 5CC  Final   Culture   Final    NO GROWTH 5 DAYS Performed at [facility]    Report Status 05/17/2015 FINAL  Final  Blood Culture (routine x 2)     Status: None   Collection Time: 05/20/2015  7:45 PM  Result Value Ref Range Status   Specimen Description BLOOD RIGHT AC  Final   Special Requests BOTTLES DRAWN AEROBIC AND ANAEROBIC 5CC  Final   Culture   Final    NO GROWTH 5 DAYS Performed at [facility]    Report Status 05/17/2015 FINAL  Final  Urine culture     Status: None   Collection Time: 05/16/2015 11:31 PM  Result Value Ref Range Status   Specimen Description URINE, CLEAN CATCH  Final   Special Requests NONE  Final   Culture   Final    NO GROWTH 1 DAY Performed at [facility]    Report Status 05/13/2015 FINAL  Final  MRSA PCR Screening     Status: None   Collection Time: 05/12/15  9:41 AM  Result Value Ref Range Status   MRSA by PCR NEGATIVE NEGATIVE Final  Comment:        The GeneXpert MRSA Assay (FDA approved for NASAL specimens only), is one component of a comprehensive MRSA colonization surveillance program. It is not intended to diagnose MRSA infection nor to guide or monitor treatment for MRSA infections.      Medications:   . antiseptic oral rinse  7 mL Mouth Rinse q12n4p  . budesonide (PULMICORT) nebulizer solution  0.25 mg Nebulization BID  . ceFEPime (MAXIPIME) IV  2 g Intravenous Q24H  . chlorhexidine  15 mL Mouth Rinse BID  . enoxaparin (LOVENOX) injection  40 mg Subcutaneous QHS  . feeding supplement (ENSURE ENLIVE)  237 mL Oral TID BM  . furosemide  20 mg Intravenous Q12H  . hydrALAZINE  10 mg Intravenous Q6H  . insulin aspart  0-9 Units Subcutaneous TID WC  . insulin glargine  10 Units Subcutaneous QHS   Continuous Infusions:   Time spent: 25 min   LOS: 7 days   Charlynne Cousins  [facility] Pager [phone]  *Please refer to [email or web address], password [email or web address] to get updated schedule on who will round on this patient, as hospitalists switch teams weekly. If 7PM-7AM, please contact night-coverage at [email or web address], password [email or web address] for any overnight needs.  05/18/2015, 7:07 AM        \

## 2015-05-18 NOTE — Progress Notes (Signed)
Name: Hayley Lewis MRN: [id number] DOB: February 23, 1924    ADMISSION DATE:  05/10/2015 CONSULTATION DATE:  4/24  REFERRING MD :  Dyann Kief   CHIEF COMPLAINT:  Worsening respiratory failure & inability to wean oxygen   BRIEF PATIENT DESCRIPTION:  82 year old female admitted on 4/19 w/ acute on chronic hypoxic respiratory failure in setting of bilateral pulmonary infiltrates & pleural effusions of uncertain etiology. PCCM asked to see on 4/24 when still not responding to aggressive diuresis and ABX.   SIGNIFICANT EVENTS   STUDIES:  2D ECHO 4/21: LVH; EF 123456; grade I diastolic dysfxn, CVP estimated at 42mm Hg; Peak PA estimated at 39mmHg;  4/20: MRSA PCR: negative RVP >>> 4/19 U strep and Legionella antigens negative  ABX vanc 4/19>>>4/23 Cefepime 4/19>>>   SUBJECTIVE:  Confused   VITAL SIGNS: Temp:  [98.8 F (37.1 C)-99.3 F (37.4 C)] 99.1 F (37.3 C) (04/26 0616) Pulse Rate:  [76-96] 96 (04/26 0936) Resp:  [20-28] 26 (04/26 0936) BP: (126-149)/(42-64) 135/55 mmHg (04/26 0936) SpO2:  [89 %-95 %] 93 % (04/26 0936) FiO2 (%):  [100 %] 100 % (04/25 1054)  Intake/Output Summary (Last 24 hours) at 05/18/15 0950 Last data filed at 05/18/15 0600  Gross per 24 hour  Intake    290 ml  Output   1485 ml  Net  -1195 ml   PHYSICAL EXAMINATION: General:  Chronically ill appearing female. Now on FM Neuro:  Awake, moves all ext. She does not speak english so not sure about orientation  HEENT:  BIPAP mask in place No  JVD  Cardiovascular:  rrr w/our MRG Lungs:  Decreased t/o. + accessory muscle use  Abdomen:  Soft, not tender + bowel sounds  Musculoskeletal:  Equal st and bulk  Skin:  Dry, intact. Warm  ABG    Component Value Date/Time   PHART 7.430 05/16/2015 1050   PCO2ART 59.5* 05/16/2015 1050   PO2ART 68.5* 05/16/2015 1050   HCO3 38.8* 05/16/2015 1050   TCO2 33.6 05/16/2015 1050   ACIDBASEDEF 2.8* 05/12/2015 0600   O2SAT 93.5 05/16/2015 1050     Recent Labs Lab  05/16/15 0318 05/17/15 0333 05/18/15 0318  NA 144 146* 141  K 4.7 3.9 3.6  CL 99* 100* 93*  CO2 37* 37* 40*  BUN 23* 26* 24*  CREATININE 0.62 0.56 0.58  GLUCOSE 253* 120* 118*    Recent Labs Lab 05/13/15 0310 05/14/15 0324 05/17/15 0333  HGB 14.1 14.0 14.4  HCT 46.3* 46.0 46.5*  WBC 14.1* 12.1* 14.3*  PLT 333 322 348   Ct Chest Wo Contrast  05/17/2015  CLINICAL DATA:  Acute on chronic respiratory failure. Metastatic colon cancer. EXAM: CT CHEST WITHOUT CONTRAST TECHNIQUE: Multidetector CT imaging of the chest was performed following the standard protocol without IV contrast. COMPARISON:  None. FINDINGS: THORACIC INLET/BODY WALL: No acute abnormality. MEDIASTINUM: Chronic cardiomegaly. No pericardial effusion. Atherosclerosis as expected for age. No adenopathy detected. LUNG WINDOWS: Small borderline moderate pleural effusion with multi segment lower and right middle lobe atelectasis. Mild patchy density in the apical lungs could also be atelectasis. No convincing pneumonia. Occasional septal thickening without definitive edema. No visible metastatic disease. UPPER ABDOMEN: No acute findings. Stable appearance of subcapsular low-density abnormality in the central liver with possible central mineralization. OSSEOUS: Chronic appearing thoracic endplate concavities.  No acute finding. IMPRESSION: Small to moderate pleural effusions with multi segment atelectasis. Electronically Signed   By: Monte Fantasia M.D.   On: 05/17/2015 02:42   Dg Shoulder  Right Port  05/18/2015  CLINICAL DATA:  Right shoulder pain, initial encounter EXAM: PORTABLE RIGHT SHOULDER - 2+ VIEW COMPARISON:  None. FINDINGS: No acute fracture or dislocation is noted. No gross soft tissue abnormality is seen. Persistent right-sided pleural effusion is noted. IMPRESSION: No acute abnormality in the right shoulder. Electronically Signed   By: Inez Catalina M.D.   On: 05/18/2015 09:00   Dg Humerus Right  05/18/2015  CLINICAL  DATA:  Right arm pain, no known injury, initial encounter EXAM: RIGHT HUMERUS - 2+ VIEW COMPARISON:  None. FINDINGS: There is no evidence of fracture or other focal bone lesions. Soft tissues are unremarkable. IMPRESSION: No acute abnormality noted. Electronically Signed   By: Inez Catalina M.D.   On: 05/18/2015 08:59  Persistent bilateral airspace disease w/ pleural effusions.   ASSESSMENT / PLAN:  Acute On Chronic Hypoxic and Hypercarbic Respiratory failure in the setting of Decompensated Pulmonary artery hypertension. ? Exposure to Schistosomiasis in the past.   Decompensated Acute on Chronic diastolic HF Cor Pulmonale  Possible PNA  Discussion She has acute on Chronic respiratory failure in the setting of what seems to be decompensated Cor Pulmonale and decompensated acute on chronic diastolic dysfxn w/ resultant pulmonary edema and bilateral pleural effusions. She has significant secondary PAH suggested by her ECHO. Not convinced that she was ever infected. Do think that the [facility] is driving her respiratory failure and this is not likely to be something reversible at age 83. I have spoken at length w/ the son and his wife. Have recommended hospice and DNR status. Nothing really further for [place] to offer.   Plan Cont NIPPV @ HS. Will likely need Cont aggressive Diuresis Will try high flow for now; Will likely need oximizer at home if she gets d/c'd Cont current abx-->consider 10d stop date  No role for steroids at this time She remains risk for intubation-->if so would get BAL.  Have highly recommended DNR status and think she is hospice appropriate.  She has Palliative care consult pending.   HTN w/ Diastolic dysfxn Gd I Plan Cont current rx  H/o adenocarcinona of colon. Currently in remission  Plan F/[facility]  DM type 2 Plan Cont ssi    Erick Colace ACNP-BC [place] Pager # [phone] OR # [phone] if no answer 05/18/2015, 9:50  AM   Attending Note:  I have examined patient, reviewed labs, studies and notes. I have discussed the case with Jerrye Bushy, and I agree with the data and plans as amended above. 82yo woman with severe PAH and evidence for cor pulmonale with acute on chronic hypoxemic resp failure. Do not believe that a formal workup of her PAH (for example auto-immune labs) or targeted therapy for PAH would be beneficial at her age.  She has benefited from diuresis and would continue this as she can tolerate. Would use biPAP prn for dyspnea as you work to get her into better volume balance. We have initiated discussions with family regarding aggressiveness of care, prognosis and code status. I recommend conservative care as we have outlined, DNR status. She would be appropriate for hospice referral given the severity (and presumed chronicity) of her PAH. Please call if we can assist in any way.   Baltazar Apo, MD, PhD 05/18/2015, 12:13 PM [facility] [phone] or if no answer [phone]

## 2015-05-18 NOTE — Evaluation (Signed)
Physical Therapy Evaluation Patient Details Name: Hayley Lewis MRN: [id number] DOB: [birth year] Today's Date: 05/18/2015   History of Present Illness  82 yo female admitted with Pn,  Acute On Chronic Hypoxic and Hypercarbic Respiratory failure in the setting of Decompensated Pulmonary artery hypertension. hx of HTN, met colon cancer.   Clinical Impression  Limited eval. Min assist +2 for bed mobility. Pt able to sit EOB ~5 minutes with very close guarding. O2 sats 88-90% on HFNC 25L during session; 91% end of session. Pt fatigued easily. Appears very weak. Spoke with family prior to session-they were okay with PT working with pt within her tolerance level. Family not present during session. Pt/family to have palliative care meeting. Will follow and progress as tolerated.     Follow Up Recommendations  (to be determined-palliative care meeting scheduled)    Equipment Recommendations   (to be determined)    Recommendations for Other Services       Precautions / Restrictions Precautions Precautions: Fall Precaution Comments: monitor sats Restrictions Weight Bearing Restrictions: No      Mobility  Bed Mobility Overal bed mobility: Needs Assistance Bed Mobility: Supine to Sit;Sit to Supine     Supine to sit: +2 for physical assistance;+2 for safety/equipment;Min assist Sit to supine: +2 for physical assistance;+2 for safety/equipment;Min assist   General bed mobility comments: Assist for trunk and bil LEs. Increased time. Sat EOB ~5 minutes. Pt had coughing spell and appeared to fatigue quickly. assisted back to bed.   Transfers                 General transfer comment: NT-too weak, fatigued  Ambulation/Gait                Stairs            Wheelchair Mobility    Modified Rankin (Stroke Patients Only)       Balance Overall balance assessment: Needs assistance Sitting-balance support: Feet unsupported Sitting balance-Leahy Scale: Fair                                        Pertinent Vitals/Pain Pain Assessment: Faces Faces Pain Scale: No hurt    Home Living Family/patient expects to be discharged to:: Unsure                 Additional Comments: unable to get info-pt non english speaking-no family present    Prior Function           Comments: unable to get info-pt non english speaking-no family present     Hand Dominance        Extremity/Trunk Assessment   Upper Extremity Assessment: Generalized weakness           Lower Extremity Assessment: Generalized weakness         Communication   Communication: Prefers language other than English  Cognition Arousal/Alertness: Awake/alert Behavior During Therapy: WFL for tasks assessed/performed Overall Cognitive Status: Difficult to assess                      General Comments      Exercises General Exercises - Lower Extremity Heel Slides: AROM;AAROM;Both;10 reps;Supine      Assessment/Plan    PT Assessment Patient needs continued PT services  PT Diagnosis Difficulty walking;Generalized weakness   PT Problem List Decreased strength;Decreased activity tolerance;Decreased balance;Decreased mobility;Cardiopulmonary status limiting activity  PT Treatment  Interventions DME instruction;Gait training;Functional mobility training;Therapeutic activities;Patient/family education;Balance training;Therapeutic exercise   PT Goals (Current goals can be found in the Care Plan section) Acute Rehab PT Goals Patient Stated Goal: none stated PT Goal Formulation: With patient/family Time For Goal Achievement: 07/01/15 Potential to Achieve Goals: Fair    Frequency Min 2X/week   Barriers to discharge        Co-evaluation               End of Session   Activity Tolerance: Patient limited by fatigue Patient left: in bed;with call bell/phone within reach;with bed alarm set           Time: 1359-1411 PT Time Calculation  (min) (ACUTE ONLY): 12 min   Charges:   PT Evaluation $PT Eval Moderate Complexity: 1 Procedure     PT G Codes:        Weston Anna, MPT Pager: [phone]

## 2015-05-19 LAB — BASIC METABOLIC PANEL
ANION GAP: 10 (ref 5–15)
BUN: 25 mg/dL — ABNORMAL HIGH (ref 6–20)
CHLORIDE: 93 mmol/L — AB (ref 101–111)
CO2: 38 mmol/L — AB (ref 22–32)
CREATININE: 0.63 mg/dL (ref 0.44–1.00)
Calcium: 8.5 mg/dL — ABNORMAL LOW (ref 8.9–10.3)
GFR calc non Af Amer: >60 mL/min (ref >60)
Glucose, Bld: 125 mg/dL — ABNORMAL HIGH (ref 65–99)
POTASSIUM: 3.8 mmol/L (ref 3.5–5.1)
SODIUM: 141 mmol/L (ref 135–145)

## 2015-05-19 LAB — CEA: CEA: 1.6 ng/mL (ref 0.0–4.7)

## 2015-05-19 LAB — GLUCOSE, CAPILLARY
GLUCOSE-CAPILLARY: 171 mg/dL — AB (ref 65–99)
Glucose-Capillary: 133 mg/dL — ABNORMAL HIGH (ref 65–99)

## 2015-05-19 MED ORDER — METOPROLOL TARTRATE 5 MG/5ML IV SOLN
5.0000 mg | Freq: Four times a day (QID) | INTRAVENOUS | Status: DC | PRN
  Administered 2015-05-21 – 2015-05-22 (×3): 5 mg via INTRAVENOUS
  Filled 2015-05-19 (×3): qty 5

## 2015-05-19 MED ORDER — METOPROLOL TARTRATE 5 MG/5ML IV SOLN
INTRAVENOUS | Status: AC
  Administered 2015-05-19: 10:00:00
  Filled 2015-05-19: qty 5

## 2015-05-19 MED ORDER — DEXTROSE-NACL 5-0.9 % IV SOLN
INTRAVENOUS | Status: DC
  Administered 2015-05-19 – 2015-05-22 (×4): via INTRAVENOUS

## 2015-05-19 NOTE — Progress Notes (Signed)
Date:  May 19, 2015 Chart reviewed for concurrent status and case management needs. Will continue to follow patient for changes and needs:  hfnc o2 saT AT 85-86% ON FI02 80%-100%/awaiting palliative care consult. Velva Harman, BSN, [place], [place]

## 2015-05-19 NOTE — Progress Notes (Signed)
Rounding on patient and family member requested that she have something to drink. Patient is on Bipap at 90% FIO2. Explained she could have her mouth swabbed and wet but could not have fluids or food.  Discussed the O2 sats are borderline low at 88%. If she were to vomit it could go into her lungs and she would not be able to breathe. Instructed to not give her anything by mouth and we would take off the bipap and wet her mouth.

## 2015-05-19 NOTE — Progress Notes (Addendum)
[facility] PROGRESS NOTE    Progress Note   Hayley Lewis [id number] DOB: 03/21/1924 DOA: 05/06/2015 PCP: No primary care provider on file.   Brief Narrative:   Hayley Lewis is an 82 y.o. female  with medical history significant of hypertension, metastatic colon cancer In November 2016 she underwent microwave ablation of her solitary liver lesion, who was brought to the emergency department due to worsening shortness of breath since the morning, w/ acute on chronic hypoxic respiratory failure in setting of bilateral pulmonary infiltrates & pleural effusions of uncertain etiology. Patient treated for CHF exacerbation and PNA. Course complicated by prolonged needs of BIPAP and needs for high flow oxygen support. PCCM is on board assisting with treatment. High concerns for PAH.  Assessment/Plan:   Acute on chronic respiratory failure secondary to HCAP/cor pulmonale /acute on chronic diastolic heart failure: - Appreciate PCCM assistance. - 2-D echo done 05/13/2015 showed severely dilated right ventricle with pulmonary pressure of greater than 70 mmHg - Cont NIPPV at bed time. Now on high flow oxygen, saturations barely above 88%. - Cont IV Abx for a total of 10 days. - Cont IV lasix, follow stricit I and O's. - Cont. Daily weights. - Will have to discuss with family goals of care. I agree she is candidate for hospice and comfort care. He has a poor prognosis. - She is not eating well with a history of Metastatic colon cancer, advanced age and severe pulmonary hypertension. - Daughter-in law will be here on 4.27.2017. 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 - Awaiting palliative Care discussion.  Sepsis due to pneumonia [facility]): Slowly improving, HR and BP stable. Has not require pressors. Cont IV cefepime, started on 4.19.2017,Stopped date for cefepime on 4.29.2017.  Essential hypertension: - Cont to hold norvasc.  Adenocarcinoma of colon Stage IV [facility]) She status post  radioablation on nov 2016 with repeat CT scan of the abdomen and pelvis on December 2016 that showed enlarging liver mass. I am concerned that her malignancy is progressing. X-ray of right arm show no fractures or lytic lesion, her CEA was 1.6.  Type 2 DM with hyperglycemia: A1c 8.8. Cont Long acting insulin plus SSI.  Obesity: BMI 32.9  Moderate protein caloric malnutrition:  New onset afib with RVR: Start metoprolol IV as she is now on bipap.    DVT Prophylaxis - Lovenox ordered.  Family Communication: grandson at bedside Disposition Plan: Keep in step down. Code Status:     Code Status Orders        Start     Ordered   05/09/2015 2332  Full code   Continuous     04/30/2015 2331    Code Status History    Date Active Date Inactive Code Status Order ID Comments User Context   12/20/2014  5:08 PM 12/22/2014  6:54 PM Full Code [id number]  [name], MD ED        IV Access:    Peripheral IV   Procedures and diagnostic studies:   Dg Shoulder Right Port  05/18/2015  CLINICAL DATA:  Right shoulder pain, initial encounter EXAM: PORTABLE RIGHT SHOULDER - 2+ VIEW COMPARISON:  None. FINDINGS: No acute fracture or dislocation is noted. No gross soft tissue abnormality is seen. Persistent right-sided pleural effusion is noted. IMPRESSION: No acute abnormality in the right shoulder. Electronically Signed   By: Inez Catalina M.D.   On: 05/18/2015 09:00   Dg Humerus Right  05/18/2015  CLINICAL DATA:  Right arm pain, no known injury,  initial encounter EXAM: RIGHT HUMERUS - 2+ VIEW COMPARISON:  None. FINDINGS: There is no evidence of fracture or other focal bone lesions. Soft tissues are unremarkable. IMPRESSION: No acute abnormality noted. Electronically Signed   By: Inez Catalina M.D.   On: 05/18/2015 08:59     Medical Consultants:    None.  Anti-Infectives:   Anti-infectives    Start     Dose/Rate Route Frequency Ordered Stop   05/13/15 1400  ceFEPIme (MAXIPIME) 2 g in  dextrose 5 % 50 mL IVPB     2 g 100 mL/hr over 30 Minutes Intravenous Every 24 hours 05/13/15 0809     05/12/15 0800  vancomycin (VANCOCIN) IVPB 750 mg/150 ml premix  Status:  Discontinued     750 mg 150 mL/hr over 60 Minutes Intravenous Every 12 hours 05/12/15 0120 05/13/15 0912   05/12/15 0200  ceFEPIme (MAXIPIME) 1 g in dextrose 5 % 50 mL IVPB  Status:  Discontinued     1 g 100 mL/hr over 30 Minutes Intravenous Every 8 hours 04/23/2015 2331 05/12/15 0122   05/12/15 0200  ceFEPIme (MAXIPIME) 1 g in dextrose 5 % 50 mL IVPB  Status:  Discontinued     1 g 100 mL/hr over 30 Minutes Intravenous Every 12 hours 05/12/15 0122 05/13/15 0809   05/05/2015 1945  piperacillin-tazobactam (ZOSYN) IVPB 3.375 g     3.375 g 100 mL/hr over 30 Minutes Intravenous  Once 05/02/2015 1941 05/18/2015 2039   05/08/2015 1945  vancomycin (VANCOCIN) IVPB 1000 mg/200 mL premix     1,000 mg 200 mL/hr over 60 Minutes Intravenous  Once 05/01/2015 1941 05/01/2015 2111      Subjective:    Hayley Lewis she relate she cont ot have right arm pain, not eating.  Objective:    Filed Vitals:   05/19/15 0421 05/19/15 0500 05/19/15 0600 05/19/15 0616  BP: 127/52   127/52  Pulse: 87   94  Temp: 99.5 F (37.5 C) 99.3 F (37.4 C) 99.3 F (37.4 C) 99.3 F (37.4 C)  TempSrc: Core (Comment)     Resp: 22 17 22 19   Height:      Weight:      SpO2: 89% 90% 89% 89%    Intake/Output Summary (Last 24 hours) at 05/19/15 0746 Last data filed at 05/19/15 0600  Gross per 24 hour  Intake    370 ml  Output   1125 ml  Net   -755 ml   Filed Weights   04/29/2015 2310 05/14/15 0450  Weight: 75.5 kg (166 lb 7.2 oz) 76.5 kg (168 lb 10.4 oz)    Exam: Gen:  NAD, cachectic Cardiovascular:  RRR, + JVD Chest and lungs:   Good air movement clear to auscultation Abdomen:  Abdomen soft, NT/ND, + BS Extremities:  No lower ext edema   Data Reviewed:    Labs: Basic Metabolic Panel:  Recent Labs Lab 05/14/15 0324 05/15/15 0323  05/16/15 0318 05/17/15 0333 05/18/15 0318  NA 139 143 144 146* 141  K 3.8 3.3* 4.7 3.9 3.6  CL 104 99* 99* 100* 93*  CO2 30 34* 37* 37* 40*  GLUCOSE 107* 96 253* 120* 118*  BUN 21* 19 23* 26* 24*  CREATININE 0.60 0.72 0.62 0.56 0.58  CALCIUM 7.5* 7.7* 8.3* 8.7* 8.4*   GFR Estimated Creatinine Clearance: 44.4 mL/min (by C-G formula based on Cr of 0.58). Liver Function Tests:  Recent Labs Lab 05/13/15 0310 05/14/15 0324  AST 47* 42*  ALT 42 55*  ALKPHOS 71 71  BILITOT 0.3 0.7  PROT 6.9 7.1  ALBUMIN 3.2* 3.3*   No results for input(s): LIPASE, AMYLASE in the last 168 hours. No results for input(s): AMMONIA in the last 168 hours. Coagulation profile No results for input(s): INR, PROTIME in the last 168 hours.  CBC:  Recent Labs Lab 05/13/15 0310 05/14/15 0324 05/17/15 0333  WBC 14.1* 12.1* 14.3*  NEUTROABS 10.5* 9.0*  --   HGB 14.1 14.0 14.4  HCT 46.3* 46.0 46.5*  MCV 87.9 87.8 85.8  PLT 333 322 348   Cardiac Enzymes: No results for input(s): CKTOTAL, CKMB, CKMBINDEX, TROPONINI in the last 168 hours. BNP (last 3 results) No results for input(s): PROBNP in the last 8760 hours. CBG:  Recent Labs Lab 05/17/15 2118 05/18/15 0757 05/18/15 1240 05/18/15 1646 05/18/15 2159  GLUCAP 114* 122* 184* 102* 149*   D-Dimer: No results for input(s): DDIMER in the last 72 hours. Hgb A1c: No results for input(s): HGBA1C in the last 72 hours. Lipid Profile: No results for input(s): CHOL, HDL, LDLCALC, TRIG, CHOLHDL, LDLDIRECT in the last 72 hours. Thyroid function studies: No results for input(s): TSH, T4TOTAL, T3FREE, THYROIDAB in the last 72 hours.  Invalid input(s): FREET3 Anemia work up: No results for input(s): VITAMINB12, FOLATE, FERRITIN, TIBC, IRON, RETICCTPCT in the last 72 hours. Sepsis Labs:  Recent Labs Lab 05/13/15 0310 05/14/15 0324 05/17/15 0333  WBC 14.1* 12.1* 14.3*   Microbiology Recent Results (from the past 240 hour(s))  Blood Culture  (routine x 2)     Status: None   Collection Time: 05/01/2015  7:40 PM  Result Value Ref Range Status   Specimen Description BLOOD LEFT WRIST  Final   Special Requests BOTTLES DRAWN AEROBIC AND ANAEROBIC 5CC  Final   Culture   Final    NO GROWTH 5 DAYS Performed at [facility]    Report Status 05/17/2015 FINAL  Final  Blood Culture (routine x 2)     Status: None   Collection Time: 05/02/2015  7:45 PM  Result Value Ref Range Status   Specimen Description BLOOD RIGHT AC  Final   Special Requests BOTTLES DRAWN AEROBIC AND ANAEROBIC 5CC  Final   Culture   Final    NO GROWTH 5 DAYS Performed at [facility]    Report Status 05/17/2015 FINAL  Final  Urine culture     Status: None   Collection Time: 05/13/2015 11:31 PM  Result Value Ref Range Status   Specimen Description URINE, CLEAN CATCH  Final   Special Requests NONE  Final   Culture   Final    NO GROWTH 1 DAY Performed at [facility]    Report Status 05/13/2015 FINAL  Final  MRSA PCR Screening     Status: None   Collection Time: 05/12/15  9:41 AM  Result Value Ref Range Status   MRSA by PCR NEGATIVE NEGATIVE Final    Comment:        The GeneXpert MRSA Assay (FDA approved for NASAL specimens only), is one component of a comprehensive MRSA colonization surveillance program. It is not intended to diagnose MRSA infection nor to guide or monitor treatment for MRSA infections.      Medications:   . antiseptic oral rinse  7 mL Mouth Rinse q12n4p  . budesonide (PULMICORT) nebulizer solution  0.25 mg Nebulization BID  . ceFEPime (MAXIPIME) IV  2 g Intravenous Q24H  . chlorhexidine  15 mL Mouth Rinse BID  . enoxaparin (  LOVENOX) injection  40 mg Subcutaneous QHS  . feeding supplement (ENSURE ENLIVE)  237 mL Oral TID BM  . furosemide  20 mg Intravenous Q12H  . hydrALAZINE  10 mg Intravenous Q6H  . insulin aspart  0-9 Units Subcutaneous TID WC  . insulin glargine  10 Units Subcutaneous QHS   Continuous  Infusions:   Time spent: 25 min   LOS: 8 days   Charlynne Cousins  [facility] Pager [phone]  *Please refer to [email or web address], password [email or web address] to get updated schedule on who will round on this patient, as hospitalists switch teams weekly. If 7PM-7AM, please contact night-coverage at [email or web address], password [email or web address] for any overnight needs.  05/19/2015, 7:46 AM        \

## 2015-05-19 NOTE — Progress Notes (Signed)
Patient visitors advised not to provide oral intake to patient while she is wearing Bi-PAP. RN walked in and patient's visitor had removed the patient's Bi-PAP mask and was providing patient with a drink. RN informed visitor and patient, again, of the risk for aspiration while wearing Bi-PAP. Patient's visitor proceeded to provide patient with liquid oral intake. [facility] on call, L. [place], made aware. Patient oxygen saturation maintains at 88-90%. Will continue to monitor.

## 2015-05-19 NOTE — Progress Notes (Signed)
RT called to bedside to place patient back on BiPAP. Patient was labored and hypoxic when RT arrived. Patient was on NRB at [place]. Patient was placed on BiPAP with previous settings IPAP +22 EPAP +10 rr 10 and 70%. Patient remained hypoxic and FiO2 increased to 100% with no improvement. EPAP titrated until O2 sats were 87% or > per Dr. Aileen Fass. MD aware of high EPAP settings at this time (at bedside with RT and RN). Patient EPAP reduced to +12 and patient is currently 89-91% on BiPAP. RT will continue to monitor patient and wean as tolerated by patient. Patient to remain on BiPAP at this time and will be placed back on HFNC with patient improvement.

## 2015-05-19 NOTE — Progress Notes (Signed)
Bedside RN called and checked with husband to discuss arrival time of his wife to hospital today.    He reports that she will not becoming until late this evening.  I will check before I finish for the evening, but if we do not get to meet, we are planning on a meeting tomorrow at [place], [facility] Team [phone]

## 2015-05-19 NOTE — Progress Notes (Signed)
I spoke briefly with patient's son last evening.  He will not be coming into town until this weekend and stated that Saturday would be the earliest that he could meet with me.  He reports his wife, who is a hospitalist at [employer], will be coming to see his mother today.  I asked him to let me know when she is coming to the hospital so that I can meet with her.  I also spoke with the patient's grandson, who is at the bedside, and I also discussed that I was hoping to meet with him and his uncle's wife when she arrives.  I gave him my card and he will let me know if he hears when she may arrive.  Discussed with bedside nurse who will keep eye out for additional family and let us know if anyone else comes to visit.  Marni Griffon and I discussed case again this AM.  Micheline Rough, MD [place] Team [phone]

## 2015-05-20 LAB — GLUCOSE, CAPILLARY
GLUCOSE-CAPILLARY: 272 mg/dL — AB (ref 65–99)
Glucose-Capillary: 215 mg/dL — ABNORMAL HIGH (ref 65–99)
Glucose-Capillary: 281 mg/dL — ABNORMAL HIGH (ref 65–99)
Glucose-Capillary: 134 mg/dL — ABNORMAL HIGH (ref 65–99)

## 2015-05-20 LAB — BASIC METABOLIC PANEL
Anion gap: 10 (ref 5–15)
BUN: 24 mg/dL — AB (ref 6–20)
CHLORIDE: 94 mmol/L — AB (ref 101–111)
CO2: 38 mmol/L — ABNORMAL HIGH (ref 22–32)
Calcium: 8.6 mg/dL — ABNORMAL LOW (ref 8.9–10.3)
Creatinine, Ser: 0.59 mg/dL (ref 0.44–1.00)
Glucose, Bld: 132 mg/dL — ABNORMAL HIGH (ref 65–99)
POTASSIUM: 3.5 mmol/L (ref 3.5–5.1)
SODIUM: 142 mmol/L (ref 135–145)

## 2015-05-20 MED ORDER — OXYCODONE HCL 5 MG PO TABS
5.0000 mg | ORAL_TABLET | ORAL | Status: DC | PRN
  Administered 2015-05-21 – 2015-05-22 (×2): 5 mg via ORAL
  Filled 2015-05-20 (×2): qty 1

## 2015-05-20 MED ORDER — CHLORHEXIDINE GLUCONATE 0.12 % MT SOLN
15.0000 mL | Freq: Two times a day (BID) | OROMUCOSAL | Status: DC
  Administered 2015-05-21 – 2015-05-22 (×2): 15 mL via OROMUCOSAL
  Filled 2015-05-20 (×4): qty 15

## 2015-05-20 MED ORDER — CETYLPYRIDINIUM CHLORIDE 0.05 % MT LIQD
7.0000 mL | Freq: Two times a day (BID) | OROMUCOSAL | Status: DC
  Administered 2015-05-20 – 2015-05-22 (×4): 7 mL via OROMUCOSAL

## 2015-05-20 MED ORDER — ACETAMINOPHEN 325 MG PO TABS
650.0000 mg | ORAL_TABLET | Freq: Four times a day (QID) | ORAL | Status: DC
  Administered 2015-05-20 – 2015-05-21 (×3): 650 mg via ORAL
  Filled 2015-05-20 (×3): qty 2

## 2015-05-20 NOTE — Progress Notes (Signed)
Per RN, pt is more awake, wanting mask off.  Pt placed back on HFNC, 25L flow 90%.  Spo2 88%.  Pt is tolerating well at this time, RN aware.  RT will continue to monitor pt.

## 2015-05-20 NOTE — Progress Notes (Signed)
Inpatient Diabetes Program Recommendations  AACE/ADA: New Consensus Statement on Inpatient Glycemic Control (2015)  Target Ranges:  Prepandial:   less than 140 mg/dL      Peak postprandial:   less than 180 mg/dL (1-2 hours)      Critically ill patients:  140 - 180 mg/dL   Review of Glycemic Control  Results for EARLEE, STROUPE (MRN [id number]) as of 05/20/2015 12:57  Ref. Range 05/18/2015 16:46 05/18/2015 21:59 05/19/2015 07:29 05/19/2015 11:47 05/20/2015 08:03  Glucose-Capillary Latest Ref Range: 65-99 mg/dL 102 (H) 149 (H) 133 (H) 171 (H) 215 (H)   Poor po intake.   Inpatient Diabetes Program Recommendations:    Consider changing Novolog to sensitive Q4H if appropriate.  Will continue to follow. Thank you. Lorenda Peck, RD, LDN, CDE Inpatient Diabetes Coordinator [phone]

## 2015-05-20 NOTE — Progress Notes (Signed)
Pt wore bipap all night.  Ipap settings were increased to achieve volumes greater than 348ml.  Pt currently on Bipap 22/10 90%.  RT unable to wean fio2 overnight, spo2 88-92%.  Per RN, family member was found providing drink to pt earlier in shift after RN/RT informed him of the risks of aspiration.  RT will continue to monitor and assess pt.

## 2015-05-20 NOTE — Consult Note (Signed)
Consultation Note Date: 05/20/2015   Patient Name: Hayley Lewis  DOB: [birth year]  MRN: [id number]  Age / Sex: 82 y.o., female  PCP: No primary care provider on file. Referring Physician: Charlynne Cousins, MD  Reason for Consultation: Establishing goals of care  HPI/Patient Profile: 82 y.o. female  with past medical history of hypertension, metastatic colon cancer (microwave ablation of her solitary liver lesion in Nov 2016), admitted w/ acute on chronic hypoxic respiratory failure in setting of bilateral pulmonary infiltrates & pleural effusions likely secondary to PAH/cor pulmonale. Patient treated for CHF exacerbation and PNA. Course complicated by prolonged needs of BIPAP and needs for high flow oxygen support.    Clinical Assessment and Goals of Care: I met today with patient's family who was present including her grandson, granddaughter, and daughter-in-law.  They report the most important things to the patient or her family and her faith.  She is reportedly a very social person.  I discussed the patient's clinical course to this point in time with family members who are present. Her grandchildren appear to have difficulty understanding the severity of her condition and the fact that we are looking at chronic disease processes that are not "fixable" problems.  We discussed that moving forward we will need to determine the best way to focus on what is most important to the patient understanding that she has a very limited prognosis regardless of additional interventions.  The primary decision maker will be the patient's son, Hayley Lewis.  He will not be able to be in town until tomorrow. We have arranged a family meeting at 2:30 PM.  His wife who is present today is a hospitalist with [employer].  She has been engaging the family in conversation as much as they are emotionally able, but there still appears to be  a large gap in their ability to except the fact that the patient is reaching the end of her life.  SUMMARY OF RECOMMENDATIONS   - I discussed patient's clinical course at length with family members who are present. This included 2 grandchildren and patient's daughter-in-law (who is a physician). - While her daughter-in-law appears to have good understanding of the situation, the rest of the family is having difficulty processing the severity of her illness. Her daughter-in-law reports that she has been discussing this with them as best she can and has been talking with her husband (patient's son) about the patient's CODE STATUS as well as long-term care planning including hospice moving forward. He has been having trouble engaging in this conversation with her. - The patient's son will be traveling into town tomorrow morning. We have arranged follow-up appointment for a family meeting tomorrow at 2:30 PM. - In the interim no change to her current plan of care. She remains full code.  Code Status/Advance Care Planning:  Full code  Symptom Management:   Pain: Discussed with patient's daughter-in-law. Will plan for scheduled Tylenol around-the-clock. Also discontinued tramadol in favor of low-dose oxycodone as needed.  Palliative Prophylaxis:   Aspiration, Bowel  Regimen and Delirium Protocol  Psycho-social/Spiritual:   Desire for further Chaplaincy support:yes  Additional Recommendations: Caregiving  Support/Resources and Education on Hospice  Prognosis:   Unable to determine due to acute illness. She is currently dependent on BiPAP and high flow nasal cannula. Without these continued interventions her expected prognosis would likely be a matter of hours to days at best.  Discharge Planning: To Be Determined      Primary Diagnoses: Present on Admission:  . HCAP (healthcare-associated pneumonia) . Acute respiratory failure with hypoxia (Hayley Lewis) . Adenocarcinoma of colon (Hayley Lewis) .  Essential hypertension . Sepsis due to pneumonia [facility])  I have reviewed the medical record, interviewed the patient and family, and examined the patient. The following aspects are pertinent.  Past Medical History  Diagnosis Date  . Hypertension   . Shortness of breath dyspnea   . Cancer (Hayley Lewis)   . Metastatic colon cancer in female [facility])    Social History   Social History  . Marital Status: Single    Spouse Name: N/A  . Number of Children: N/A  . Years of Education: N/A   Social History Main Topics  . Smoking status: Former Smoker    Quit date: 01/23/2007  . Smokeless tobacco: None  . Alcohol Use: No  . Drug Use: No  . Sexual Activity: Not Asked   Other Topics Concern  . None   Social History Narrative   No family history on file. Scheduled Meds: . acetaminophen  650 mg Oral Q6H  . antiseptic oral rinse  7 mL Mouth Rinse q12n4p  . budesonide (PULMICORT) nebulizer solution  0.25 mg Nebulization BID  . ceFEPime (MAXIPIME) IV  2 g Intravenous Q24H  . chlorhexidine  15 mL Mouth Rinse BID  . enoxaparin (LOVENOX) injection  40 mg Subcutaneous QHS  . feeding supplement (ENSURE ENLIVE)  237 mL Oral TID BM  . furosemide  20 mg Intravenous Q12H  . hydrALAZINE  10 mg Intravenous Q6H  . insulin aspart  0-9 Units Subcutaneous TID WC  . insulin glargine  10 Units Subcutaneous QHS   Continuous Infusions: . dextrose 5 % and 0.9% NaCl 50 mL/hr at 05/20/15 0600   PRN Meds:.albuterol, ipratropium, metoprolol, MUSCLE RUB, ondansetron **OR** ondansetron (ZOFRAN) IV, oxyCODONE Medications Prior to Admission:  Prior to Admission medications   Medication Sig Start Date End Date Taking? Authorizing Provider  amLODipine (NORVASC) 5 MG tablet Take 5 mg by mouth daily.   Yes Historical Provider, MD  Multiple Vitamin (MULTIVITAMIN WITH MINERALS) TABS tablet Take 1 tablet by mouth daily.   Yes Historical Provider, MD   No Known Allergies Review of Systems  Unable to obtain  Physical Exam   General: Alert, awake, in no acute distress.  HEENT: No bruits, no goiter, + JVD Heart: Regular rate and rhythm. No murmur appreciated. Lungs: Fair air movement Abdomen: Soft, nontender, nondistended, positive bowel sounds.  Ext: No significant edema Skin: Warm and dry  Vital Signs: BP 159/76 mmHg  Pulse 94  Temp(Src) 98.4 F (36.9 C) (Core (Comment))  Resp 29  Ht 5' (1.524 m)  Wt 76.5 kg (168 lb 10.4 oz)  BMI 32.94 kg/m2  SpO2 80%  LMP  Pain Assessment: CPOT POSS *See Group Information*: S-Acceptable,Sleep, easy to arouse Pain Score: 0-No pain   SpO2: SpO2: (!) 80 % O2 Device:SpO2: (!) 80 % O2 Flow Rate: .O2 Flow Rate (L/min): 25 L/min  IO: Intake/output summary:  Intake/Output Summary (Last 24 hours) at 05/20/15 1321 Last data filed at  05/20/15 1300  Gross per 24 hour  Intake 728.33 ml  Output   1325 ml  Net -596.67 ml    LBM: Last BM Date: 05/19/15 Baseline Weight: Weight: 75.5 kg (166 lb 7.2 oz) Most recent weight: Weight: 76.5 kg (168 lb 10.4 oz)     Palliative Assessment/Data:     Time In: 1030 Time Out: 1150 Time Total: 80 Greater than 50%  of this time was spent counseling and coordinating care related to the above assessment and plan.  Signed by: Micheline Rough, MD   Please contact Palliative Medicine Team phone at [phone] for questions and concerns.  For individual provider: See Shea Evans

## 2015-05-20 NOTE — Progress Notes (Signed)
[facility] PROGRESS NOTE    Progress Note   Hayley Lewis [id number] DOB: 30-Oct-1924 DOA: 05/09/2015 PCP: No primary care provider on file.   Brief Narrative:   Hayley Lewis is an 82 y.o. female  with medical history significant of hypertension, metastatic colon cancer In November 2016 she underwent microwave ablation of her solitary liver lesion, who was brought to the emergency department due to worsening shortness of breath since the morning, w/ acute on chronic hypoxic respiratory failure in setting of bilateral pulmonary infiltrates & pleural effusions of uncertain etiology. Patient treated for CHF exacerbation and PNA. Course complicated by prolonged needs of BIPAP and needs for high flow oxygen support.PCCM as sign off, as they have no else to offer. High concerns for PAH.  Assessment/Plan:   Acute on chronic respiratory failure secondary to HCAP/cor pulmonale /acute on chronic diastolic heart failure: - Appreciate PCCM assistance. - 2-D echo done 05/13/2015 showed severely dilated right ventricle with pulmonary pressure of greater than 70 mmHg - Cont NIPPV at bed time. Now on high flow oxygen, saturations barely above 88%. - Cont IV Abx for a total of 10 days. - Cont IV lasix, follow stricit I and O's. - Will have to discuss with family goals of care. I agree she is candidate for hospice and comfort care. - She is not eating well with a history of Metastatic colon cancer, advanced age and severe pulmonary hypertension. - Awaiting family meeting with PMT,she has a very poor prognosis. - Awaiting palliative Care discussion.  Sepsis due to pneumonia [facility]): Slowly improving, HR and BP stable. Has not require pressors. Cont IV cefepime, started on 4.19.2017,Stopped date for cefepime on 4.29.2017.  Essential hypertension: - Cont to hold norvasc.  Adenocarcinoma of colon Stage IV [facility]) She status post radioablation on nov 2016 with repeat CT scan of the abdomen  and pelvis on December 2016 that showed enlarging liver mass.  Type 2 DM with hyperglycemia: A1c 8.8. Cont Long acting insulin plus SSI.  Obesity: BMI 32.9  Moderate protein caloric malnutrition:  New Paroxysmal afib with RVR: Now resolved, cont IV metoprolol prn.    DVT Prophylaxis - Lovenox ordered.  Family Communication: grandson at bedside Disposition Plan: Keep in step down. Code Status:     Code Status Orders        Start     Ordered   05/22/2015 2332  Full code   Continuous     05/17/2015 2331    Code Status History    Date Active Date Inactive Code Status Order ID Comments User Context   12/20/2014  5:08 PM 12/22/2014  6:54 PM Full Code [id number]  [name], MD ED        IV Access:    Peripheral IV   Procedures and diagnostic studies:   Dg Shoulder Right Port  05/18/2015  CLINICAL DATA:  Right shoulder pain, initial encounter EXAM: PORTABLE RIGHT SHOULDER - 2+ VIEW COMPARISON:  None. FINDINGS: No acute fracture or dislocation is noted. No gross soft tissue abnormality is seen. Persistent right-sided pleural effusion is noted. IMPRESSION: No acute abnormality in the right shoulder. Electronically Signed   By: Inez Catalina M.D.   On: 05/18/2015 09:00   Dg Humerus Right  05/18/2015  CLINICAL DATA:  Right arm pain, no known injury, initial encounter EXAM: RIGHT HUMERUS - 2+ VIEW COMPARISON:  None. FINDINGS: There is no evidence of fracture or other focal bone lesions. Soft tissues are unremarkable. IMPRESSION: No acute abnormality noted. Electronically Signed  By: Inez Catalina M.D.   On: 05/18/2015 08:59     Medical Consultants:    None.  Anti-Infectives:   Anti-infectives    Start     Dose/Rate Route Frequency Ordered Stop   05/13/15 1400  ceFEPIme (MAXIPIME) 2 g in dextrose 5 % 50 mL IVPB     2 g 100 mL/hr over 30 Minutes Intravenous Every 24 hours 05/13/15 0809 05/21/15 2359   05/12/15 0800  vancomycin (VANCOCIN) IVPB 750 mg/150 ml premix   Status:  Discontinued     750 mg 150 mL/hr over 60 Minutes Intravenous Every 12 hours 05/12/15 0120 05/13/15 0912   05/12/15 0200  ceFEPIme (MAXIPIME) 1 g in dextrose 5 % 50 mL IVPB  Status:  Discontinued     1 g 100 mL/hr over 30 Minutes Intravenous Every 8 hours 05/14/2015 2331 05/12/15 0122   05/12/15 0200  ceFEPIme (MAXIPIME) 1 g in dextrose 5 % 50 mL IVPB  Status:  Discontinued     1 g 100 mL/hr over 30 Minutes Intravenous Every 12 hours 05/12/15 0122 05/13/15 0809   05/06/2015 1945  piperacillin-tazobactam (ZOSYN) IVPB 3.375 g     3.375 g 100 mL/hr over 30 Minutes Intravenous  Once 05/08/2015 1941 05/07/2015 2039   05/18/2015 1945  vancomycin (VANCOCIN) IVPB 1000 mg/200 mL premix     1,000 mg 200 mL/hr over 60 Minutes Intravenous  Once 05/20/2015 1941 05/20/2015 2111      Subjective:    Hayley Lewis breathing not improved, not eating.  Objective:    Filed Vitals:   05/20/15 0359 05/20/15 0400 05/20/15 0500 05/20/15 0600  BP: 136/54 122/58  122/58  Pulse: 89   94  Temp: 99.7 F (37.6 C) 99.7 F (37.6 C) 99.9 F (37.7 C) 99.3 F (37.4 C)  TempSrc:  Core (Comment)    Resp: 24 25 18 24   Height:      Weight:      SpO2: 89% 90% 89% 88%    Intake/Output Summary (Last 24 hours) at 05/20/15 0713 Last data filed at 05/20/15 0600  Gross per 24 hour  Intake 378.33 ml  Output   1225 ml  Net -846.67 ml   Filed Weights   05/15/2015 2310 05/14/15 0450  Weight: 75.5 kg (166 lb 7.2 oz) 76.5 kg (168 lb 10.4 oz)    Exam: Gen:  NAD, cachectic Cardiovascular:  RRR, + JVD Chest and lungs:   Good air movement clear to auscultation Abdomen:  Abdomen soft, NT/ND, + BS Extremities:  No lower ext edema   Data Reviewed:    Labs: Basic Metabolic Panel:  Recent Labs Lab 05/16/15 0318 05/17/15 0333 05/18/15 0318 05/19/15 0843 05/20/15 0306  NA 144 146* 141 141 142  K 4.7 3.9 3.6 3.8 3.5  CL 99* 100* 93* 93* 94*  CO2 37* 37* 40* 38* 38*  GLUCOSE 253* 120* 118* 125* 132*  BUN  23* 26* 24* 25* 24*  CREATININE 0.62 0.56 0.58 0.63 0.59  CALCIUM 8.3* 8.7* 8.4* 8.5* 8.6*   GFR Estimated Creatinine Clearance: 44.4 mL/min (by C-G formula based on Cr of 0.59). Liver Function Tests:  Recent Labs Lab 05/14/15 0324  AST 42*  ALT 55*  ALKPHOS 71  BILITOT 0.7  PROT 7.1  ALBUMIN 3.3*   No results for input(s): LIPASE, AMYLASE in the last 168 hours. No results for input(s): AMMONIA in the last 168 hours. Coagulation profile No results for input(s): INR, PROTIME in the last 168 hours.  CBC:  Recent Labs Lab 05/14/15 0324 05/17/15 0333  WBC 12.1* 14.3*  NEUTROABS 9.0*  --   HGB 14.0 14.4  HCT 46.0 46.5*  MCV 87.8 85.8  PLT 322 348   Cardiac Enzymes: No results for input(s): CKTOTAL, CKMB, CKMBINDEX, TROPONINI in the last 168 hours. BNP (last 3 results) No results for input(s): PROBNP in the last 8760 hours. CBG:  Recent Labs Lab 05/18/15 1240 05/18/15 1646 05/18/15 2159 05/19/15 0729 05/19/15 1147  GLUCAP 184* 102* 149* 133* 171*   D-Dimer: No results for input(s): DDIMER in the last 72 hours. Hgb A1c: No results for input(s): HGBA1C in the last 72 hours. Lipid Profile: No results for input(s): CHOL, HDL, LDLCALC, TRIG, CHOLHDL, LDLDIRECT in the last 72 hours. Thyroid function studies: No results for input(s): TSH, T4TOTAL, T3FREE, THYROIDAB in the last 72 hours.  Invalid input(s): FREET3 Anemia work up: No results for input(s): VITAMINB12, FOLATE, FERRITIN, TIBC, IRON, RETICCTPCT in the last 72 hours. Sepsis Labs:  Recent Labs Lab 05/14/15 0324 05/17/15 0333  WBC 12.1* 14.3*   Microbiology Recent Results (from the past 240 hour(s))  Blood Culture (routine x 2)     Status: None   Collection Time: 05/22/2015  7:40 PM  Result Value Ref Range Status   Specimen Description BLOOD LEFT WRIST  Final   Special Requests BOTTLES DRAWN AEROBIC AND ANAEROBIC 5CC  Final   Culture   Final    NO GROWTH 5 DAYS Performed at [facility]     Report Status 05/17/2015 FINAL  Final  Blood Culture (routine x 2)     Status: None   Collection Time: 04/29/2015  7:45 PM  Result Value Ref Range Status   Specimen Description BLOOD RIGHT AC  Final   Special Requests BOTTLES DRAWN AEROBIC AND ANAEROBIC 5CC  Final   Culture   Final    NO GROWTH 5 DAYS Performed at [facility]    Report Status 05/17/2015 FINAL  Final  Urine culture     Status: None   Collection Time: 05/09/2015 11:31 PM  Result Value Ref Range Status   Specimen Description URINE, CLEAN CATCH  Final   Special Requests NONE  Final   Culture   Final    NO GROWTH 1 DAY Performed at [facility]    Report Status 05/13/2015 FINAL  Final  MRSA PCR Screening     Status: None   Collection Time: 05/12/15  9:41 AM  Result Value Ref Range Status   MRSA by PCR NEGATIVE NEGATIVE Final    Comment:        The GeneXpert MRSA Assay (FDA approved for NASAL specimens only), is one component of a comprehensive MRSA colonization surveillance program. It is not intended to diagnose MRSA infection nor to guide or monitor treatment for MRSA infections.      Medications:   . antiseptic oral rinse  7 mL Mouth Rinse q12n4p  . budesonide (PULMICORT) nebulizer solution  0.25 mg Nebulization BID  . ceFEPime (MAXIPIME) IV  2 g Intravenous Q24H  . chlorhexidine  15 mL Mouth Rinse BID  . enoxaparin (LOVENOX) injection  40 mg Subcutaneous QHS  . feeding supplement (ENSURE ENLIVE)  237 mL Oral TID BM  . furosemide  20 mg Intravenous Q12H  . hydrALAZINE  10 mg Intravenous Q6H  . insulin aspart  0-9 Units Subcutaneous TID WC  . insulin glargine  10 Units Subcutaneous QHS   Continuous Infusions: . dextrose 5 % and 0.9% NaCl  50 mL/hr at 05/20/15 0600    Time spent: 25 min   LOS: 9 days   Charlynne Cousins  [facility] Pager [phone]  *Please refer to [email or web address], password [email or web address] to get updated schedule on who will round on this patient, as hospitalists  switch teams weekly. If 7PM-7AM, please contact night-coverage at [email or web address], password [email or web address] for any overnight needs.  05/20/2015, 7:13 AM        \

## 2015-05-20 NOTE — Progress Notes (Signed)
Chaplain visit in response to a referral from the daytime chaplain. Ms Halfmann. Is having increasingly hard time breathing. She is very stressed. Family members state she is a Catholic adherent and request that a priest come sometime Saturday, April 29 to give a blessing of the sick. She currently is not a member or a Energy manager, therefore the monthly on-call priest at [facility] was called and a message was left for a priest to come on Saturday, if possible. Family asked that the chaplain pray for Ms Neri as well. When asked what she wished the chaplain to pray for, her son translated the request and the resulting prayer.  Ms Lober is surrounded by family who are active in her care physically, mentally and spiritually.  Page at any time should Ms Delany need or request further spiritual care.  [name]. Landon Bassford, DMin, MDiv Chaplain

## 2015-05-20 NOTE — Progress Notes (Signed)
Nutrition Follow-up  DOCUMENTATION CODES:   Obesity unspecified  INTERVENTION:  -Ensure Enlive po TID, each supplement provides 350 kcal and 20 grams of protein  -RD to monitor for goals of care  NUTRITION DIAGNOSIS:   Inadequate oral intake related to inability to eat (on bipap) as evidenced by meal completion < 25%. ongoing  GOAL:   Patient will meet greater than or equal to 90% of their needs  Not meeting  MONITOR:   PO intake, Supplement acceptance, Labs, Weight trends, I & O's  REASON FOR ASSESSMENT:   Consult Assessment of nutrition requirement/status  ASSESSMENT:   82 y.o. female with medical history significant of hypertension, metastatic colon cancer who was brought to the emergency department due to worsening shortness of breath since the morning. O2 sat initially in the ER was in the 30s. She is on 2 LPM oxygen via nasal cannula at home. The patient is originally from [place] and does not speak english.  Pt was in palliative care meeting during visit, goals of care need to be determined with patient approaching end of life. Per RN, patient's PO intake has been poor. Appetite is poor concurrent with end of life status, medical acuity, and mentation.  Pt has refused Ensure Enlive 8 of last 10 times it has been offered to her. Pt is on HFNC -> continues to wear BIPAP at night. Family members have been providing her with PO intake while patient is on BIPAP as well. Sepsis is slowly improving;  Labs: CBGs 133-215 Medications: D5 @ 50 -> 204 calories  Diet Order:  Diet heart healthy/carb modified Room service appropriate?: Yes; Fluid consistency:: Thin  Skin:  Reviewed, no issues  Last BM:  4/23  Height:   Ht Readings from Last 1 Encounters:  04/26/2015 5' (1.524 m)    Weight:   Wt Readings from Last 1 Encounters:  05/14/15 168 lb 10.4 oz (76.5 kg)    Ideal Body Weight:  45.5 kg  BMI:  Body mass index is 32.94 kg/(m^2).  Estimated Nutritional  Needs:   Kcal:  1400-1600  Protein:  60-70g  Fluid:  1.6L/day  EDUCATION NEEDS:   No education needs identified at this time  Satira Anis. Boni Maclellan, MS, RD LDN After Hours/Weekend Pager [phone]

## 2015-05-20 NOTE — Progress Notes (Signed)
Attempted to meet with family this AM at 7:00.  Her granddaughter was only family present.    She reports that patient's daughter in law (with whom I was planning to meet this AM) will be present later today.  The patient's son will be arriving late this evening and be here tomorrow.  I will continue to try to meet with family for discussion.  Her care team is aware and will call if additional family arrives.  Micheline Rough, MD [place] Team [phone]

## 2015-05-21 DIAGNOSIS — Z515 Encounter for palliative care: Secondary | ICD-10-CM | POA: Insufficient documentation

## 2015-05-21 DIAGNOSIS — R0602 Shortness of breath: Secondary | ICD-10-CM | POA: Insufficient documentation

## 2015-05-21 DIAGNOSIS — J9620 Acute and chronic respiratory failure, unspecified whether with hypoxia or hypercapnia: Secondary | ICD-10-CM | POA: Insufficient documentation

## 2015-05-21 DIAGNOSIS — Z7189 Other specified counseling: Secondary | ICD-10-CM | POA: Insufficient documentation

## 2015-05-21 LAB — GLUCOSE, CAPILLARY
GLUCOSE-CAPILLARY: 424 mg/dL — AB (ref 65–99)
Glucose-Capillary: 150 mg/dL — ABNORMAL HIGH (ref 65–99)
Glucose-Capillary: 100 mg/dL — ABNORMAL HIGH (ref 65–99)
Glucose-Capillary: 167 mg/dL — ABNORMAL HIGH (ref 65–99)
Glucose-Capillary: 97 mg/dL (ref 65–99)
Glucose-Capillary: 445 mg/dL — ABNORMAL HIGH (ref 65–99)

## 2015-05-21 LAB — BASIC METABOLIC PANEL
Anion gap: 7 (ref 5–15)
BUN: 32 mg/dL — AB (ref 6–20)
CHLORIDE: 98 mmol/L — AB (ref 101–111)
CO2: 39 mmol/L — AB (ref 22–32)
CREATININE: 0.54 mg/dL (ref 0.44–1.00)
Calcium: 8.6 mg/dL — ABNORMAL LOW (ref 8.9–10.3)
GFR calc Af Amer: >60 mL/min (ref >60)
GFR calc non Af Amer: >60 mL/min (ref >60)
GLUCOSE: 139 mg/dL — AB (ref 65–99)
POTASSIUM: 3.5 mmol/L (ref 3.5–5.1)
SODIUM: 144 mmol/L (ref 135–145)

## 2015-05-21 MED ORDER — INSULIN GLARGINE 100 UNIT/ML ~~LOC~~ SOLN
10.0000 [IU] | Freq: Two times a day (BID) | SUBCUTANEOUS | Status: DC
  Administered 2015-05-21 – 2015-05-22 (×3): 10 [IU] via SUBCUTANEOUS
  Filled 2015-05-21 (×4): qty 0.1

## 2015-05-21 NOTE — Progress Notes (Addendum)
[facility] PROGRESS NOTE    Progress Note   Hayley Lewis [id number] DOB: 1924/08/01 DOA: 04/26/2015 PCP: No primary care provider on file.   Brief Narrative:   Hayley Lewis is an 82 y.o. female  with medical history significant of hypertension, metastatic colon cancer In November 2016 she underwent microwave ablation of her solitary liver lesion, who was brought to the emergency department due to worsening shortness of breath since the morning, w/ acute on chronic hypoxic respiratory failure in setting of bilateral pulmonary infiltrates & pleural effusions of uncertain etiology. Patient treated for CHF exacerbation and PNA. Course complicated by prolonged needs of BIPAP and needs for high flow oxygen support.PCCM as sign off, as they have no else to offer. Completed course of abx treatment and has been kept on the dry side.  Assessment/Plan:   Acute on chronic respiratory failure secondary to HCAP/cor pulmonale /acute on chronic diastolic heart failure: - Appreciate PCCM assistance. - 2-D echo done 05/13/2015 showed severely dilated right ventricle with pulmonary pressure of greater than 70 mmHg - Cont NIPPV at bed time. Will try to transition to high flow oxygen during the day. - Completed course of IV Abx. - Cont IV lasix, follow stricit I and O's. - PMT meet initially with family, no change in her code status or direction of treatment. - She is not eating well with a history of Metastatic colon cancer, advanced age and severe pulmonary hypertension. - She has a poor prognosis. - PMT to meet again with family Again today 2:30 PM. After having a long discussion with family about goals of care they still wanted to be a full code but they would like to feed her. I have explained to them the risk and benefits of doing this and they would like to take a risk and feed her. I have spoken to them that there is a very high risk of her aspirating, and a she is not able to  tolerate a nonrebreather. Right now she is on BiPAP at 100% with 22 and 12 of pressure and her saturations are just 90%. I have also explained to them that every time she eats she would have to stop breathing and noted to swallow and this would compromise her saturations and even her mentation as she gets confused when her oxygen drops below 85%.  Sepsis due to pneumonia [facility]): Resolved., HR and BP stable. Has not require pressors. Will complted abx regimen today.  Essential hypertension: - Cont to hold norvasc.  Adenocarcinoma of colon Stage IV [facility]) She status post radioablation on nov 2016 with repeat CT scan of the abdomen and pelvis on December 2016 that showed enlarging liver mass.  Type 2 DM with hyperglycemia: A1c 8.8. Cont Long acting insulin plus SSI. Has not been having a good apetitie.  Obesity: BMI 32.9  Moderate protein caloric malnutrition:  New Paroxysmal afib with RVR: Now resolved, cont IV metoprolol prn.    DVT Prophylaxis - Lovenox ordered.  Family Communication: grandson at bedside Disposition Plan: Keep in step down. Code Status:     Code Status Orders        Start     Ordered   04/24/2015 2332  Full code   Continuous     05/09/2015 2331    Code Status History    Date Active Date Inactive Code Status Order ID Comments User Context   12/20/2014  5:08 PM 12/22/2014  6:54 PM Full Code [id number]  [name], MD ED  IV Access:    Peripheral IV   Procedures and diagnostic studies:   No results found.   Medical Consultants:    None.  Anti-Infectives:   Anti-infectives    Start     Dose/Rate Route Frequency Ordered Stop   05/13/15 1400  ceFEPIme (MAXIPIME) 2 g in dextrose 5 % 50 mL IVPB     2 g 100 mL/hr over 30 Minutes Intravenous Every 24 hours 05/13/15 0809 05/21/15 2359   05/12/15 0800  vancomycin (VANCOCIN) IVPB 750 mg/150 ml premix  Status:  Discontinued     750 mg 150 mL/hr over 60 Minutes Intravenous Every 12 hours  05/12/15 0120 05/13/15 0912   05/12/15 0200  ceFEPIme (MAXIPIME) 1 g in dextrose 5 % 50 mL IVPB  Status:  Discontinued     1 g 100 mL/hr over 30 Minutes Intravenous Every 8 hours 05/13/2015 2331 05/12/15 0122   05/12/15 0200  ceFEPIme (MAXIPIME) 1 g in dextrose 5 % 50 mL IVPB  Status:  Discontinued     1 g 100 mL/hr over 30 Minutes Intravenous Every 12 hours 05/12/15 0122 05/13/15 0809   05/21/2015 1945  piperacillin-tazobactam (ZOSYN) IVPB 3.375 g     3.375 g 100 mL/hr over 30 Minutes Intravenous  Once 05/13/2015 1941 05/22/2015 2039   05/15/2015 1945  vancomycin (VANCOCIN) IVPB 1000 mg/200 mL premix     1,000 mg 200 mL/hr over 60 Minutes Intravenous  Once 05/12/2015 1941 05/08/2015 2111      Subjective:    Hayley Lewis breathing not improved, not eating.  Objective:    Filed Vitals:   05/21/15 0200 05/21/15 0300 05/21/15 0307 05/21/15 0400  BP:    118/63  Pulse:   85   Temp: 98.8 F (37.1 C) 98.8 F (37.1 C)  98.8 F (37.1 C)  TempSrc:      Resp: 23 22 14 28   Height:      Weight:      SpO2: 90% 94% 94% 91%    Intake/Output Summary (Last 24 hours) at 05/21/15 0716 Last data filed at 05/21/15 0500  Gross per 24 hour  Intake   1150 ml  Output   1000 ml  Net    150 ml   Filed Weights   05/07/2015 2310 05/14/15 0450  Weight: 75.5 kg (166 lb 7.2 oz) 76.5 kg (168 lb 10.4 oz)    Exam: Gen:  NAD, cachectic Cardiovascular:  RRR, + JVD Chest and lungs:   Good air movement clear to auscultation Abdomen:  Abdomen soft, NT/ND, + BS Extremities:  No lower ext edema   Data Reviewed:    Labs: Basic Metabolic Panel:  Recent Labs Lab 05/17/15 0333 05/18/15 0318 05/19/15 0843 05/20/15 0306 05/21/15 0311  NA 146* 141 141 142 144  K 3.9 3.6 3.8 3.5 3.5  CL 100* 93* 93* 94* 98*  CO2 37* 40* 38* 38* 39*  GLUCOSE 120* 118* 125* 132* 139*  BUN 26* 24* 25* 24* 32*  CREATININE 0.56 0.58 0.63 0.59 0.54  CALCIUM 8.7* 8.4* 8.5* 8.6* 8.6*   GFR Estimated Creatinine Clearance:  44.4 mL/min (by C-G formula based on Cr of 0.54). Liver Function Tests: No results for input(s): AST, ALT, ALKPHOS, BILITOT, PROT, ALBUMIN in the last 168 hours. No results for input(s): LIPASE, AMYLASE in the last 168 hours. No results for input(s): AMMONIA in the last 168 hours. Coagulation profile No results for input(s): INR, PROTIME in the last 168 hours.  CBC:  Recent Labs Lab 05/17/15  0333  WBC 14.3*  HGB 14.4  HCT 46.5*  MCV 85.8  PLT 348   Cardiac Enzymes: No results for input(s): CKTOTAL, CKMB, CKMBINDEX, TROPONINI in the last 168 hours. BNP (last 3 results) No results for input(s): PROBNP in the last 8760 hours. CBG:  Recent Labs Lab 05/19/15 1147 05/20/15 0803 05/20/15 1152 05/20/15 1651 05/20/15 2212  GLUCAP 171* 215* 281* 272* 134*   D-Dimer: No results for input(s): DDIMER in the last 72 hours. Hgb A1c: No results for input(s): HGBA1C in the last 72 hours. Lipid Profile: No results for input(s): CHOL, HDL, LDLCALC, TRIG, CHOLHDL, LDLDIRECT in the last 72 hours. Thyroid function studies: No results for input(s): TSH, T4TOTAL, T3FREE, THYROIDAB in the last 72 hours.  Invalid input(s): FREET3 Anemia work up: No results for input(s): VITAMINB12, FOLATE, FERRITIN, TIBC, IRON, RETICCTPCT in the last 72 hours. Sepsis Labs:  Recent Labs Lab 05/17/15 0333  WBC 14.3*   Microbiology Recent Results (from the past 240 hour(s))  Blood Culture (routine x 2)     Status: None   Collection Time: 05/04/2015  7:40 PM  Result Value Ref Range Status   Specimen Description BLOOD LEFT WRIST  Final   Special Requests BOTTLES DRAWN AEROBIC AND ANAEROBIC 5CC  Final   Culture   Final    NO GROWTH 5 DAYS Performed at [facility]    Report Status 05/17/2015 FINAL  Final  Blood Culture (routine x 2)     Status: None   Collection Time: 04/28/2015  7:45 PM  Result Value Ref Range Status   Specimen Description BLOOD RIGHT AC  Final   Special Requests BOTTLES  DRAWN AEROBIC AND ANAEROBIC 5CC  Final   Culture   Final    NO GROWTH 5 DAYS Performed at [facility]    Report Status 05/17/2015 FINAL  Final  Urine culture     Status: None   Collection Time: 05/10/2015 11:31 PM  Result Value Ref Range Status   Specimen Description URINE, CLEAN CATCH  Final   Special Requests NONE  Final   Culture   Final    NO GROWTH 1 DAY Performed at [facility]    Report Status 05/13/2015 FINAL  Final  MRSA PCR Screening     Status: None   Collection Time: 05/12/15  9:41 AM  Result Value Ref Range Status   MRSA by PCR NEGATIVE NEGATIVE Final    Comment:        The GeneXpert MRSA Assay (FDA approved for NASAL specimens only), is one component of a comprehensive MRSA colonization surveillance program. It is not intended to diagnose MRSA infection nor to guide or monitor treatment for MRSA infections.      Medications:   . acetaminophen  650 mg Oral Q6H  . antiseptic oral rinse  7 mL Mouth Rinse q12n4p  . budesonide (PULMICORT) nebulizer solution  0.25 mg Nebulization BID  . ceFEPime (MAXIPIME) IV  2 g Intravenous Q24H  . chlorhexidine  15 mL Mouth Rinse BID  . enoxaparin (LOVENOX) injection  40 mg Subcutaneous QHS  . feeding supplement (ENSURE ENLIVE)  237 mL Oral TID BM  . furosemide  20 mg Intravenous Q12H  . hydrALAZINE  10 mg Intravenous Q6H  . insulin aspart  0-9 Units Subcutaneous TID WC  . insulin glargine  10 Units Subcutaneous QHS   Continuous Infusions: . dextrose 5 % and 0.9% NaCl 50 mL/hr at 05/20/15 1936    Time spent: 15 min  LOS: 10 days   Charlynne Cousins  [facility] Pager [phone]  *Please refer to [email or web address], password [email or web address] to get updated schedule on who will round on this patient, as hospitalists switch teams weekly. If 7PM-7AM, please contact night-coverage at [email or web address], password [email or web address] for any overnight needs.  05/21/2015, 7:16 AM        \

## 2015-05-21 NOTE — Progress Notes (Addendum)
Patients family requesting she be able to eat breakfast. Family educated patient can not eat on BiPap. Patient trialed off of BiPap on HFNC at 25L,  patient refused to keep cannula in place. Patient agitated and restless. Patient then trialed on a non-rebreather and a partial non-rebreather. Patient did not tolerate any alternative device besides BiPap. Patient subsequently placed back on bipap. RT in room. Patient and family educated and RN to continue to monitor. MD notified.

## 2015-05-21 NOTE — Progress Notes (Signed)
Patients family wished to feed patient and MD agreed with the understanding that the family understood the high risk of aspiration. Family stated understanding. RT put pt on HFNC and patient tolerated eggs and Ensure.  Patient requested to be put on bed pan, pt became shob and O2 sat 77% family member began to panic saying "help her, help her" so pt was placed back on BiPap. O2 sat has returned to mid 80s. Will continue to monitor.

## 2015-05-21 NOTE — Progress Notes (Signed)
CBG 424, MD notified and changed Lantus frequency to BID. MD also advised to give the Novolog now at the 399 cbg sliding scale dose.

## 2015-05-21 NOTE — Progress Notes (Signed)
Per PT/family request- RT removed PT from BiPAP and placed on HFNC (90% with 25 lpm flow)- highest Sp02 80%. RT increased HFNC to 100% with 30 lpm flow- highest Sp02 82%. RT then placed PT on 100% NRB PT complaining of nasal- highest Sp02 83%. RT placed PT back on BiPAP with increase in fi02 to 100%- Sp02 87%. RN aware.

## 2015-05-21 NOTE — Progress Notes (Signed)
Daily Progress Note   Patient Name: Hayley Lewis       Date: 05/21/2015 DOB: [birth year]  Age: 82 y.o. MRN#: [id number] Attending Physician: Charlynne Cousins, MD Primary Care Physician: No primary care provider on file. Admit Date: 05/03/2015  Reason for Consultation/Follow-up: Establishing goals of care  Subjective: With the patient's son, daughter-in-law, and 2 grandchildren.  We began discussion by reviewing what is most important to the patient.  They report that her family has always been a large part of her life. She has a large family with over 76 grandchildren. She also has a very strong faith and his [employer] by tradition.  We talked about their understanding of her clinical condition. Family has a good understanding of the fact that she has pulmonary hypertension as well as cor pulmonale. They're very strong in their faith and remain hopeful that she will get better through God's intervention.  They tell me that in their culture, one does not tell someone if they have a terminal condition. This is seen as a lack of faith in God's ability to heal and therefore family requests that she not be told that she is going to die from this illness. They do agree that doing anything that would maximize her time is their goal.  We discussed that continuing to proceed with more aggressive interventions is not a pathway toward her ever being well enough to leave the hospital. Her son is in agreement with plans to continue current therapy with no escalation of care beyond BiPAP if her condition were to continue to decline.  We also plan to meet on Monday morning at 9:30 in order to continue conversation regarding long-term goals of care.   Length of Stay: 10  Current  Medications: Scheduled Meds:  . acetaminophen  650 mg Oral Q6H  . antiseptic oral rinse  7 mL Mouth Rinse q12n4p  . budesonide (PULMICORT) nebulizer solution  0.25 mg Nebulization BID  . chlorhexidine  15 mL Mouth Rinse BID  . enoxaparin (LOVENOX) injection  40 mg Subcutaneous QHS  . feeding supplement (ENSURE ENLIVE)  237 mL Oral TID BM  . furosemide  20 mg Intravenous Q12H  . hydrALAZINE  10 mg Intravenous Q6H  . insulin aspart  0-9 Units Subcutaneous TID WC  . insulin glargine  10 Units Subcutaneous  BID    Continuous Infusions: . dextrose 5 % and 0.9% NaCl 50 mL/hr at 05/20/15 1936    PRN Meds: albuterol, ipratropium, metoprolol, MUSCLE RUB, ondansetron **OR** ondansetron (ZOFRAN) IV, oxyCODONE  Physical Exam         General: Alert, awake, in no acute distress.  HEENT: No bruits, no goiter, + JVD Heart: Regular rate and rhythm. No murmur appreciated. Lungs: Fair air movement Abdomen: Soft, nontender, nondistended, positive bowel sounds.  Ext: No significant edema Skin: Warm and dry  Vital Signs: BP 192/71 mmHg  Pulse 118  Temp(Src) 99.3 F (37.4 C) (Core (Comment))  Resp 17  Ht 5' (1.524 m)  Wt 76.5 kg (168 lb 10.4 oz)  BMI 32.94 kg/m2  SpO2 81%  LMP  SpO2: SpO2: (!) 81 % O2 Device: O2 Device: Bi-PAP O2 Flow Rate: O2 Flow Rate (L/min): 25 L/min  Intake/output summary:  Intake/Output Summary (Last 24 hours) at 05/21/15 1939 Last data filed at 05/21/15 1600  Gross per 24 hour  Intake   1350 ml  Output    875 ml  Net    475 ml   LBM: Last BM Date: 05/19/15 Baseline Weight: Weight: 75.5 kg (166 lb 7.2 oz) Most recent weight: Weight: 76.5 kg (168 lb 10.4 oz)       Palliative Assessment/Data:      Patient Active Problem List   Diagnosis Date Noted  . Acute on chronic diastolic heart failure ([place]) 05/18/2015  . Moderate protein-calorie malnutrition ([place]) 05/18/2015  . Cor, pulmonale, acute ([place])   . HCAP (healthcare-associated pneumonia) 04/29/2015   . Sepsis due to pneumonia ([place]) 05/21/2015  . Adenocarcinoma of colon ([place]) 12/22/2014  . Acute respiratory failure with hypoxia ([place]) 12/20/2014  . Lobar pneumonia, unspecified organism ([place]) 12/20/2014  . Abdominal pain 12/20/2014  . Essential hypertension 12/20/2014  . IDA (iron deficiency anemia) 12/20/2014  . Hematuria 12/20/2014    Palliative Care Assessment & Plan   Patient Profile: 82 year old female with advanced pulmonary hypertension and cor pulmonale  Recommendations/Plan:  Plan is to continue with current therapies been no escalation of care if her condition continues to decline. I changed her CODE STATUS to partial code with continuation of BiPAP but no further escalation of care  Continue Tylenol on scheduled basis for pain  We discussed again regarding oxycodone for shortness of breath or pain. Her family wants to ensure that she gets this at night, but report that she would not want to have other doses throughout the day as it makes her sleepy. I advised them that we need to ensure there were doing her best to ensure her comfort moving forward and that we should encourage her to take doses of medication if needed for her comfort.  Family is adamant that she wants to continue to eat. We discussed the risk of this including risk of aspiration. We'll plan to continue diet as she tolerates and requests. Whenever she is eating, recommend she sit fully upright, where high flow nasal cannula, cannot have food with BiPAP, and be supervised as much as possible.   Code Status:    Code Status Orders        Start     Ordered   05/21/15 1543  Limited resuscitation (code)   Continuous    Question Answer Comment  In the event of cardiac or respiratory ARREST: Initiate Code Blue, Call Rapid Response No   In the event of cardiac or respiratory ARREST: Perform CPR No   In the  event of cardiac or respiratory ARREST: Perform Intubation/Mechanical Ventilation No   In the event of  cardiac or respiratory ARREST: Use NIPPV/BiPAp only if indicated Yes   In the event of cardiac or respiratory ARREST: Administer ACLS medications if indicated No   In the event of cardiac or respiratory ARREST: Perform Defibrillation or Cardioversion if indicated No      05/21/15 1544    Code Status History    Date Active Date Inactive Code Status Order ID Comments User Context   05/19/2015 11:31 PM 05/21/2015  3:44 PM Full Code [id number]  Reubin Milan, MD Inpatient   12/20/2014  5:08 PM 12/22/2014  6:54 PM Full Code [id number]  [name], MD ED       Prognosis:   < 2 weeks  Discharge Planning:  To Be Determined  Care plan was discussed with patient's family, Dr. Venetia Constable  Thank you for allowing the Palliative Medicine Team to assist in the care of this patient.   Time In: 1430 Time Out: 1540 Total Time 70 Prolonged Time Billed Yes       Greater than 50%  of this time was spent counseling and coordinating care related to the above assessment and plan.  Micheline Rough, MD  Please contact Palliative Medicine Team phone at [phone] for questions and concerns.

## 2015-05-21 NOTE — Progress Notes (Signed)
Chaplain follow-up the reason for this visit.  Ms Hayley Lewis is an 82 year old patient with extreme anxiety over her current medical condition. She speaks and understands Row[place] and the family translates for her. It is routine for the family to speak in the same language when talking among themselves.  Family has gathered to consult with Patient Management and Palliative Care. Son, Amil Amen, who has flown in from [place], is the family spokesperson. A daughter-in-law, who is usually present,  is a Engineer, drilling at [employer]. Family has not come to terms with the fact that likely this is an EOL situation, and are slowly gaining insight into this fact. [name] definitely has not come to terms with this possibility. The daughter-in-law grasps it, but may not be status to convince the rest of this fact.  [name] is [employer]. At the family's request, the on-call priest at [facility] was called last night to provide a blessing of the sick on Saturday. The family grew anxious for a priest to arrive today, and the parish was called again. Msgr Bertram Gala asked to speak to the family directly and spoke to son Belenda Cruise on the phone to set an appointment for sometime in the afternoon on Saturday 29 April, 2017. It is possible that the Msgr will come around the same time as the Palliative Care [place] meeting scheduled at 1430.  Chaplain was asked to pray with the family, Prayer were lifted.  [name]. Peola Joynt, DMin, MDiv, MA Chaplain

## 2015-05-21 NOTE — Progress Notes (Signed)
Per family request- RT removed PT from BiPAP and placed on heated HFNC (100% with 25 lpm flow). Current Sp02 86% (RN states that MD stated he is aware that Sp02 goals will not be met off BiPAP). PT family at bedside and administering nutrition. RN aware.

## 2015-05-22 DIAGNOSIS — I10 Essential (primary) hypertension: Secondary | ICD-10-CM

## 2015-05-22 DIAGNOSIS — C189 Malignant neoplasm of colon, unspecified: Secondary | ICD-10-CM

## 2015-05-22 DIAGNOSIS — Z7189 Other specified counseling: Secondary | ICD-10-CM

## 2015-05-22 DIAGNOSIS — A419 Sepsis, unspecified organism: Principal | ICD-10-CM

## 2015-05-22 DIAGNOSIS — J9620 Acute and chronic respiratory failure, unspecified whether with hypoxia or hypercapnia: Secondary | ICD-10-CM

## 2015-05-22 DIAGNOSIS — I5033 Acute on chronic diastolic (congestive) heart failure: Secondary | ICD-10-CM

## 2015-05-22 DIAGNOSIS — J189 Pneumonia, unspecified organism: Secondary | ICD-10-CM | POA: Insufficient documentation

## 2015-05-22 DIAGNOSIS — E440 Moderate protein-calorie malnutrition: Secondary | ICD-10-CM

## 2015-05-22 DIAGNOSIS — C787 Secondary malignant neoplasm of liver and intrahepatic bile duct: Secondary | ICD-10-CM

## 2015-05-22 LAB — GLUCOSE, CAPILLARY
GLUCOSE-CAPILLARY: 206 mg/dL — AB (ref 65–99)
GLUCOSE-CAPILLARY: 214 mg/dL — AB (ref 65–99)
GLUCOSE-CAPILLARY: 301 mg/dL — AB (ref 65–99)

## 2015-05-22 MED ORDER — HYDROMORPHONE HCL 1 MG/ML IJ SOLN
0.2000 mg | INTRAMUSCULAR | Status: DC | PRN
  Administered 2015-05-22 (×2): 0.2 mg via INTRAVENOUS
  Filled 2015-05-22 (×2): qty 1

## 2015-05-22 MED ORDER — OXYCODONE HCL 20 MG/ML PO CONC
5.0000 mg | ORAL | Status: DC | PRN
  Filled 2015-05-22: qty 1

## 2015-05-22 MED ORDER — HYDROMORPHONE HCL 1 MG/ML IJ SOLN
0.2000 mg | INTRAMUSCULAR | Status: DC | PRN
  Administered 2015-05-22 (×2): 0.2 mg via INTRAVENOUS
  Administered 2015-05-22 – 2015-05-23 (×3): 0.5 mg via INTRAVENOUS
  Filled 2015-05-22 (×5): qty 1

## 2015-05-22 NOTE — Progress Notes (Signed)
PROGRESS NOTE    Hayley Lewis  [id number] DOB: [birth year] DOA: 05/05/2015 PCP: No primary care provider on file.  Outpatient Specialists:  Brief Narrative: Hayley Lewis is an 82 y.o. female with medical history significant of hypertension, metastatic colon cancer In November 2016 she underwent microwave ablation of her solitary liver lesion, who was brought to the emergency department due to worsening shortness of breath since the morning, w/ acute on chronic hypoxic respiratory failure in setting of bilateral pulmonary infiltrates & pleural effusions of uncertain etiology. Patient treated for CHF exacerbation and PNA. Course complicated by prolonged needs of BIPAP and needs for high flow oxygen support.PCCM as sign off, as they have no else to offer. Completed course of abx treatment and has been kept on the dry side.  Assessment & Plan  Acute on chronic respiratory failure secondary to HCAP/cor pulmonale /acute on chronic diastolic heart failure: - 2-D echo done 05/13/2015 showed severely dilated right ventricle with pulmonary pressure of greater than 70 mmHg -Continue IV Lasix -Completed course of IV Abx. -Monitor intake and output, daily weights -Palliative care consulted and appreciated, patient's CODE STATUS currently DO NOT RESUSCITATE, DO NOT INTUBATE, only BiPAP -Patient's family does not wish for her to have morphine or any type of opiates at this time that may cloud her mentation -Patient continues to have respiratory failure and is unable to come off of BiPAP. Oxygen saturations in the 70s at this time.  Sepsis due to pneumonia [facility]): -Resolved., HR and BP stable. Has not require pressors. -Completed antibiotic regimen  Essential hypertension: -Amlodipine held  Adenocarcinoma of colon Stage IV ([place]) -status post radioablation on nov 2016 with repeat CT scan of the abdomen and pelvis on December 2016 that showed enlarging liver mass.  Type 2 DM with  hyperglycemia: -A1c 8.8. Cont Long acting insulin plus SSI. -Unable TEE due to poor appetite as well as having BiPAP mask in place  Obesity: -BMI 32.9  Moderate protein caloric malnutrition  New Paroxysmal afib with RVR: -Cont IV metoprolol prn.  DVT Prophylaxis  Lovenox  Code Status: Limited, DNR, DNI. May only use BiPAP  Family Communication: Family at bedside  Disposition Plan: Admitted.   Consultants Palliative care PCCM  Procedures  Echocardiogram  Antibiotics   Anti-infectives    Start     Dose/Rate Route Frequency Ordered Stop   05/13/15 1400  ceFEPIme (MAXIPIME) 2 g in dextrose 5 % 50 mL IVPB     2 g 100 mL/hr over 30 Minutes Intravenous Every 24 hours 05/13/15 0809 05/21/15 1434   05/12/15 0800  vancomycin (VANCOCIN) IVPB 750 mg/150 ml premix  Status:  Discontinued     750 mg 150 mL/hr over 60 Minutes Intravenous Every 12 hours 05/12/15 0120 05/13/15 0912   05/12/15 0200  ceFEPIme (MAXIPIME) 1 g in dextrose 5 % 50 mL IVPB  Status:  Discontinued     1 g 100 mL/hr over 30 Minutes Intravenous Every 8 hours 05/05/2015 2331 05/12/15 0122   05/12/15 0200  ceFEPIme (MAXIPIME) 1 g in dextrose 5 % 50 mL IVPB  Status:  Discontinued     1 g 100 mL/hr over 30 Minutes Intravenous Every 12 hours 05/12/15 0122 05/13/15 0809   05/18/2015 1945  piperacillin-tazobactam (ZOSYN) IVPB 3.375 g     3.375 g 100 mL/hr over 30 Minutes Intravenous  Once 05/12/2015 1941 05/02/2015 2039   05/10/2015 1945  vancomycin (VANCOCIN) IVPB 1000 mg/200 mL premix     1,000 mg 200 mL/hr over 60 Minutes  Intravenous  Once 05/10/2015 1941 05/12/2015 2111      Subjective:   Hayley Lewis seen and examined today.   Continues to have BIPAP on.  Would like a cold washcloth for her face.   Objective:   Filed Vitals:   05/22/15 0600 05/22/15 0823 05/22/15 0902 05/22/15 1155  BP: 180/74 143/66  142/58  Pulse:  132    Temp: 99.3 F (37.4 C)     TempSrc:      Resp: 24 30    Height:      Weight:        SpO2: 85% 77% 73%     Intake/Output Summary (Last 24 hours) at 05/22/15 1211 Last data filed at 05/22/15 0816  Gross per 24 hour  Intake    840 ml  Output   1000 ml  Net   -160 ml   Filed Weights   05/02/2015 2310 05/14/15 0450  Weight: 75.5 kg (166 lb 7.2 oz) 76.5 kg (168 lb 10.4 oz)    Exam  General: Well developed, cachetic, elderly  HEENT: NCAT, BiPAP mask in place.  Neck: Supple, + JVD, no masses  Cardiovascular: S1 S2 auscultated, tachycardic  Respiratory: Clear to Auscultation  Abdomen: Soft, nontender, nondistended, + bowel sounds  Extremities: warm dry without cyanosis clubbing or edema  Data Reviewed: I have personally reviewed following labs and imaging studies  CBC:  Recent Labs Lab 05/17/15 0333  WBC 14.3*  HGB 14.4  HCT 46.5*  MCV 85.8  PLT 0000000   Basic Metabolic Panel:  Recent Labs Lab 05/17/15 0333 05/18/15 0318 05/19/15 0843 05/20/15 0306 05/21/15 0311  NA 146* 141 141 142 144  K 3.9 3.6 3.8 3.5 3.5  CL 100* 93* 93* 94* 98*  CO2 37* 40* 38* 38* 39*  GLUCOSE 120* 118* 125* 132* 139*  BUN 26* 24* 25* 24* 32*  CREATININE 0.56 0.58 0.63 0.59 0.54  CALCIUM 8.7* 8.4* 8.5* 8.6* 8.6*   GFR: Estimated Creatinine Clearance: 44.4 mL/min (by C-G formula based on Cr of 0.54). Liver Function Tests: No results for input(s): AST, ALT, ALKPHOS, BILITOT, PROT, ALBUMIN in the last 168 hours. No results for input(s): LIPASE, AMYLASE in the last 168 hours. No results for input(s): AMMONIA in the last 168 hours. Coagulation Profile: No results for input(s): INR, PROTIME in the last 168 hours. Cardiac Enzymes: No results for input(s): CKTOTAL, CKMB, CKMBINDEX, TROPONINI in the last 168 hours. BNP (last 3 results) No results for input(s): PROBNP in the last 8760 hours. HbA1C: No results for input(s): HGBA1C in the last 72 hours. CBG:  Recent Labs Lab 05/21/15 0842 05/21/15 1241 05/21/15 1615 05/21/15 2151 05/22/15 0741  GLUCAP 167* 97 424*  445* 206*   Lipid Profile: No results for input(s): CHOL, HDL, LDLCALC, TRIG, CHOLHDL, LDLDIRECT in the last 72 hours. Thyroid Function Tests: No results for input(s): TSH, T4TOTAL, FREET4, T3FREE, THYROIDAB in the last 72 hours. Anemia Panel: No results for input(s): VITAMINB12, FOLATE, FERRITIN, TIBC, IRON, RETICCTPCT in the last 72 hours. Urine analysis:    Component Value Date/Time   COLORURINE YELLOW 05/14/2015 2331   APPEARANCEUR TURBID* 05/14/2015 2331   LABSPEC 1.029 05/08/2015 2331   PHURINE 5.0 05/10/2015 2331   GLUCOSEU NEGATIVE 05/09/2015 2331   HGBUR NEGATIVE 05/06/2015 2331   BILIRUBINUR NEGATIVE 05/02/2015 2331   KETONESUR NEGATIVE 05/17/2015 2331   PROTEINUR 100* 05/06/2015 2331   NITRITE NEGATIVE 05/17/2015 2331   LEUKOCYTESUR NEGATIVE 05/20/2015 2331   Sepsis Labs: @LABRCNTIP (procalcitonin:4,lacticidven:4)  )No results  found for this or any previous visit (from the past 240 hour(s)).    Radiology Studies: No results found.   Scheduled Meds: . acetaminophen  650 mg Oral Q6H  . antiseptic oral rinse  7 mL Mouth Rinse q12n4p  . budesonide (PULMICORT) nebulizer solution  0.25 mg Nebulization BID  . chlorhexidine  15 mL Mouth Rinse BID  . enoxaparin (LOVENOX) injection  40 mg Subcutaneous QHS  . feeding supplement (ENSURE ENLIVE)  237 mL Oral TID BM  . furosemide  20 mg Intravenous Q12H  . hydrALAZINE  10 mg Intravenous Q6H  . insulin aspart  0-9 Units Subcutaneous TID WC  . insulin glargine  10 Units Subcutaneous BID   Continuous Infusions: . dextrose 5 % and 0.9% NaCl 50 mL/hr at 05/21/15 2204     LOS: 11 days   Time Spent in minutes   30 minutes  Hayley Lewis D.O. on 05/22/2015 at 12:11 PM  Between 7am to 7pm - Pager - [phone]  After 7pm go to [email or web address] - password [email or web address]  And look for the night coverage person covering for me after hours  [facility] Office  [phone]

## 2015-05-22 NOTE — Progress Notes (Signed)
Per Family request, Pt placed on High Flow [place] at Fi02 = 1.00, flow = 40L Family assisted Pt to drink w RN and RT at bedside. Pt Sp02 decreased to 76% RT returned Pt to BiPap. Sp02 = 78% at this time.

## 2015-05-22 NOTE — Progress Notes (Signed)
Hayley Lewis is more frightened today as her breathing is more difficult. That said, she is much comforted by the visit of Hayley Lewis from Hayley Lewis who came on Saturday to visit and provide the [facility] of anointment of the sick. Her children report it made her very happy for a priest to come, but felt more blessed that the priest is a Automotive engineer in [employer]. The family continues a watch and wait stand on Hayley Lewis's condition, still praying and hoping for medical healing.  Reference Dr Kirstie Mirza note of 3:40 pm on 29 April dealing with the family's faith stands and their culture's view of speaking about EOL issues with the patient and with the family. This gives good insight into the tension between faith positions and medical diagnosis.  Per family request, chaplain support should be daily follow up and care.  [name]. [name], DMin, MDiv Chaplain

## 2015-05-22 NOTE — Progress Notes (Signed)
Daily Progress Note   Patient Name: Hayley Lewis       Date: 05/22/2015 DOB: 04/06/1924  Age: 82 y.o. MRN#: [id number] Attending Physician: Cristal Ford, DO Primary Care Physician: No primary care provider on file. Admit Date: 05/10/2015  Reason for Consultation/Follow-up: Establishing goals of care and Non pain symptom management  Subjective: With the patient's son and daughter-in-law.  We discussed change in her clinical condition and the fact that her oxygenation continues to worsen despite continuation of Bipap.  I shared with them that I think that her body is shutting down and she is dying.  Her son reports agreeing with this assessment.  He stated that she has been asking for a cold drink and that he wants to be able for her to have this despite the risk of aspiration.    We also talked about using opioids for symptomatic relief of her dyspnea.  He reports that he remains concerned about her ability to speak and interact with her family and is not agreeable to scheduling doses of opioids at this time.  We discussed both oral (she has been doing well on oxycodone) ond parenteral options.  He agreed with my making both an oral (Oxycodone concentrate) and parenteral (dilaudid) formulation available, but he does not want to have doses given at this time.    Length of Stay: 11  Current Medications: Scheduled Meds:  . acetaminophen  650 mg Oral Q6H  . antiseptic oral rinse  7 mL Mouth Rinse q12n4p  . budesonide (PULMICORT) nebulizer solution  0.25 mg Nebulization BID  . chlorhexidine  15 mL Mouth Rinse BID  . enoxaparin (LOVENOX) injection  40 mg Subcutaneous QHS  . feeding supplement (ENSURE ENLIVE)  237 mL Oral TID BM  . furosemide  20 mg Intravenous Q12H  . hydrALAZINE  10 mg  Intravenous Q6H  . insulin aspart  0-9 Units Subcutaneous TID WC  . insulin glargine  10 Units Subcutaneous BID    Continuous Infusions: . dextrose 5 % and 0.9% NaCl 50 mL/hr at 05/21/15 2204    PRN Meds: albuterol, HYDROmorphone (DILAUDID) injection, ipratropium, metoprolol, MUSCLE RUB, ondansetron **OR** ondansetron (ZOFRAN) IV, oxyCODONE  Physical Exam         General: Awake, on bipap  HEENT: No bruits, no goiter, + JVD Heart: Tachycardic. No murmur appreciated.  Lungs: Fair air movement, bipap on Abdomen: Soft, nontender, nondistended, positive bowel sounds.  Ext: No significant edema Skin: diaphoretic  Vital Signs: BP 143/66 mmHg  Pulse 132  Temp(Src) 99.3 F (37.4 C) (Axillary)  Resp 30  Ht 5' (1.524 m)  Wt 76.5 kg (168 lb 10.4 oz)  BMI 32.94 kg/m2  SpO2 73%  LMP  SpO2: SpO2: (!) 73 % O2 Device: O2 Device: Bi-PAP O2 Flow Rate: O2 Flow Rate (L/min): 30 L/min  Intake/output summary:   Intake/Output Summary (Last 24 hours) at 05/22/15 1126 Last data filed at 05/22/15 0816  Gross per 24 hour  Intake   1040 ml  Output   1100 ml  Net    -60 ml   LBM: Last BM Date: 05/21/15 Baseline Weight: Weight: 75.5 kg (166 lb 7.2 oz) Most recent weight: Weight: 76.5 kg (168 lb 10.4 oz)       Palliative Assessment/Data:    Patient Active Problem List   Diagnosis Date Noted  . SOB (shortness of breath)   . Palliative care encounter   . Goals of care, counseling/discussion   . Acute on chronic respiratory failure ([place])   . Acute on chronic diastolic heart failure ([place]) 05/18/2015  . Moderate protein-calorie malnutrition ([place]) 05/18/2015  . Cor, pulmonale, acute ([place])   . HCAP (healthcare-associated pneumonia) 05/05/2015  . Sepsis due to pneumonia ([place]) 05/04/2015  . Adenocarcinoma of colon ([place]) 12/22/2014  . Acute respiratory failure with hypoxia ([place]) 12/20/2014  . Lobar pneumonia, unspecified organism ([place]) 12/20/2014  . Abdominal pain 12/20/2014  . Essential  hypertension 12/20/2014  . IDA (iron deficiency anemia) 12/20/2014  . Hematuria 12/20/2014    Palliative Care Assessment & Plan   Patient Profile: 82 year old female with advanced pulmonary hypertension and cor pulmonale.  She appears to have entered phase of actively dying.  Recommendations/Plan:  Plan is to continue with current therapies been no escalation of care as her condition continues to decline.  Bipap, but no further escalation of care.  Continue Tylenol on scheduled basis for pain  We discussed again regarding opioids for shortness of breath or pain. This remains a difficult subject for her family.  Family remains hesitant and we talked about this at length.  I placed order for both oxycodone PO or dilaudid IV for use as needed.  Family will let his nurse know if she appears uncomfortable and if family is are agreeable to prn opioid dose.  Family is clear that she wants to continue to drink. We again discussed the risk of this including risk of aspiration. We'll plan to continue diet as she tolerates and requests for her comfort.   Code Status:    Code Status Orders        Start     Ordered   05/21/15 1543  Limited resuscitation (code)   Continuous    Question Answer Comment  In the event of cardiac or respiratory ARREST: Initiate Code Blue, Call Rapid Response No   In the event of cardiac or respiratory ARREST: Perform CPR No   In the event of cardiac or respiratory ARREST: Perform Intubation/Mechanical Ventilation No   In the event of cardiac or respiratory ARREST: Use NIPPV/BiPAp only if indicated Yes   In the event of cardiac or respiratory ARREST: Administer ACLS medications if indicated No   In the event of cardiac or respiratory ARREST: Perform Defibrillation or Cardioversion if indicated No      05/21/15 1544    Code Status History  Date Active Date Inactive Code Status Order ID Comments User Context   05/12/2015 11:31 PM 05/21/2015  3:44 PM Full Code  [id number]  Reubin Milan, MD Inpatient   12/20/2014  5:08 PM 12/22/2014  6:54 PM Full Code [id number]  [name], MD ED       Prognosis:   Hours - Days most likely  Discharge Planning:  To Be Determined- I suspect this will be a terminal admission   Care plan was discussed with patient's family, Dr. Ree Kida  Thank you for allowing the Palliative Medicine Team to assist in the care of this patient.   Time In: 1050 Time Out: 1135 Total Time 45 Prolonged Time Billed No      Greater than 50%  of this time was spent counseling and coordinating care related to the above assessment and plan.  Micheline Rough, MD  Please contact Palliative Medicine Team phone at [phone] for questions and concerns.

## 2015-05-22 NOTE — Progress Notes (Signed)
Patient oxygen level at 73% on 100% BiPAP. HR 140's notified Dr. Ree Kida of change in patients condition. Will continue to monitor.

## 2015-05-23 DIAGNOSIS — J9621 Acute and chronic respiratory failure with hypoxia: Secondary | ICD-10-CM

## 2015-05-23 DIAGNOSIS — Z515 Encounter for palliative care: Secondary | ICD-10-CM

## 2015-05-23 LAB — GLUCOSE, CAPILLARY: Glucose-Capillary: 183 mg/dL — ABNORMAL HIGH (ref 65–99)

## 2015-05-23 DEATH — deceased

## 2015-06-23 NOTE — Progress Notes (Signed)
Family member wants to bathe the patient. Family also stated that pt's final wishes include going to the morgue in her own clothes. Awaiting another family member to bring clothes from home so the corpse can be sent to morgue.  Family has not yet selected a Funeral home.  Family is given the AC's phone number to call when they have the funeral home information.

## 2015-06-23 NOTE — Progress Notes (Signed)
Patient expired at 0706 this morning, pronounced by Darrin Nipper, RN and Carla Drape, RN.

## 2015-06-23 NOTE — Discharge Summary (Signed)
Death Summary  Teegan Kieffer [id number] DOB: [birth year] DOA: 05/20/2015  PCP: No primary care provider on file. PCP/Office notified: None  Admit date: 05/20/15 Date of Death: 2015-06-01  Final Diagnoses:  Acute on chronic respiratory failure secondary to HCAP/cor pulmonale /acute on chronic diastolic heart failure Sepsis due to pneumonia [facility]) Essential hypertension: Adenocarcinoma of colon Stage IV ([place]) Type 2 DM with hyperglycemia: Obesity: Moderate protein caloric malnutrition New Paroxysmal afib with RVR  History of present illness:  On 05/12/2015 by Nechama Guard is a 82 y.o. female with medical history significant of hypertension, metastatic colon cancer who was brought to the emergency department due to worsening shortness of breath since the morning. O2 sat initially in the ER was in the 30s. She is on 2 LPM oxygen via nasal cannula at home. The patient is originally from [place] and does not speak english.  Hospital Course:  Hospital course complicated by worsening respiratory failure.  She required BiPAP.  Decision was made to make patient DNR/DNI.  Family wanted to continue BiPAP.  Despite being on BiPAP, patient's O2 sats continued to drop.  Palliative care consulted.  Patient passed peacefully on 01-Jun-2015 at 0706, with family at bedside. Death certificate completed and left in the unit.   Acute on chronic respiratory failure secondary to HCAP/cor pulmonale /acute on chronic diastolic heart failure: - 2-D echo done 05/13/2015 showed severely dilated right ventricle with pulmonary pressure of greater than 70 mmHg -was placed on  IV Lasix -Completed course of IV Abx. -Monitor intake and output, daily weights -Palliative care consulted and appreciated, patient's CODE STATUS currently DO NOT RESUSCITATE, DO NOT INTUBATE, only BiPAP -Patient's family does not wish for her to have morphine or any type of opiates at this time that may cloud her  mentation -Patient continued to have respiratory failure and is unable to come off of BiPAP. Oxygen saturations in the 70s at this time.  Sepsis due to pneumonia [facility]): -Resolved., HR and BP stable. Has not require pressors. -Completed antibiotic regimen  Essential hypertension: -Amlodipine held  Adenocarcinoma of colon Stage IV ([place]) -status post radioablation on nov 2016 with repeat CT scan of the abdomen and pelvis on December 2016 that showed enlarging liver mass.  Type 2 DM with hyperglycemia: -A1c 8.8. Cont Long acting insulin plus SSI. -Unable TEE due to poor appetite as well as having BiPAP mask in place  Obesity: -BMI 32.9  Moderate protein caloric malnutrition  New Paroxysmal afib with RVR: -was placed on IV metoprolol prn.  Time: 20 minutes  Signed:  Cristal Ford  [facility] Jun 01, 2015, 9:03 AM

## 2015-06-23 DEATH — deceased

## 2017-04-06 IMAGING — CT CT CHEST W/O CM
2 of 4 series · 13 of 36 positions shown, 16 images · non-contrast
Comparison: None.

CLINICAL DATA: Acute on chronic respiratory failure. Metastatic
colon cancer.

EXAM:
CT CHEST WITHOUT CONTRAST
TECHNIQUE: Multidetector CT imaging of the chest was performed following the
standard protocol without IV contrast.

[Series 5: chest w/o st · axial · non-contrast · 0.61mm/px · z∈[-255,-24]mm · 10 of 95 slices shown, 13 images]
[im 9/95  mediastinal]
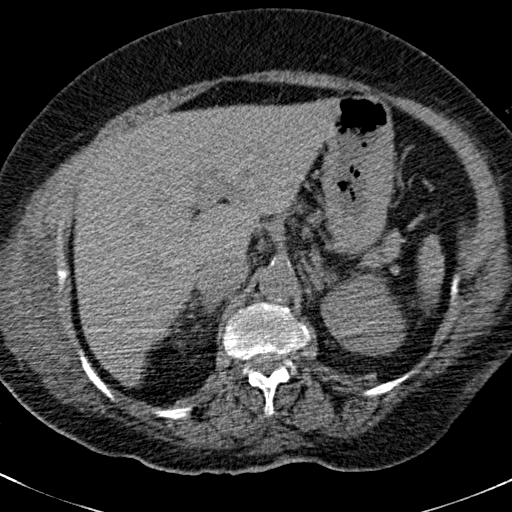
[im 9/95  lung]
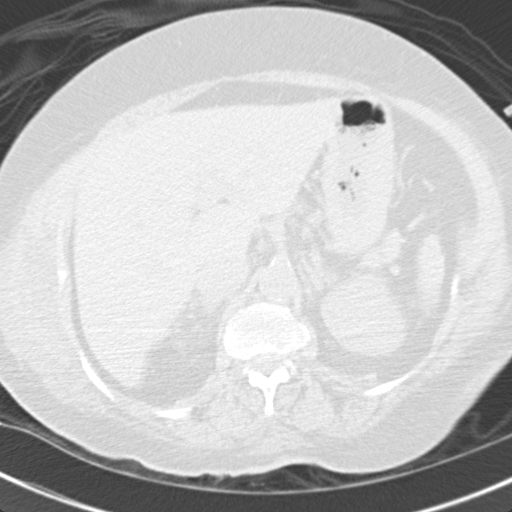
[im 18/95  lung]
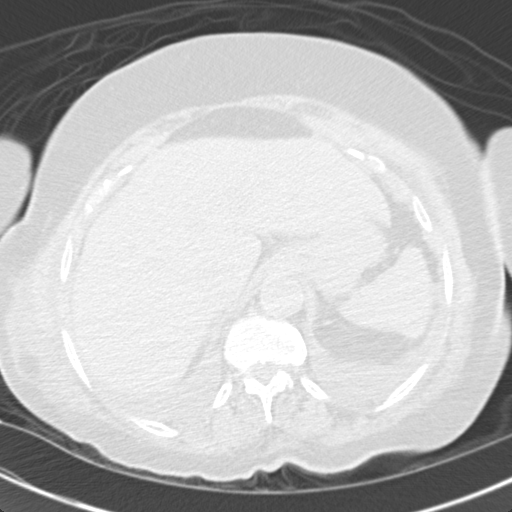
[im 26/95  lung]
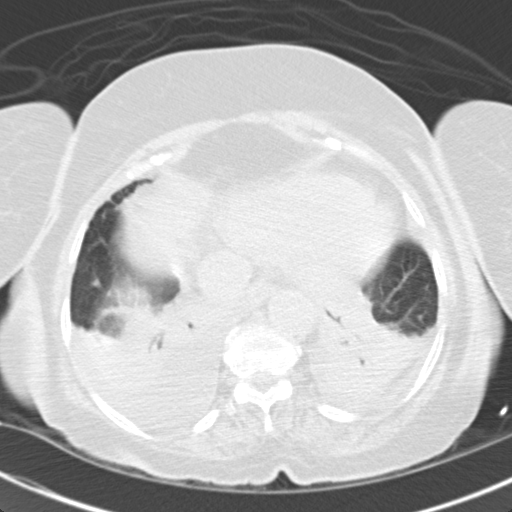
[im 35/95  lung]
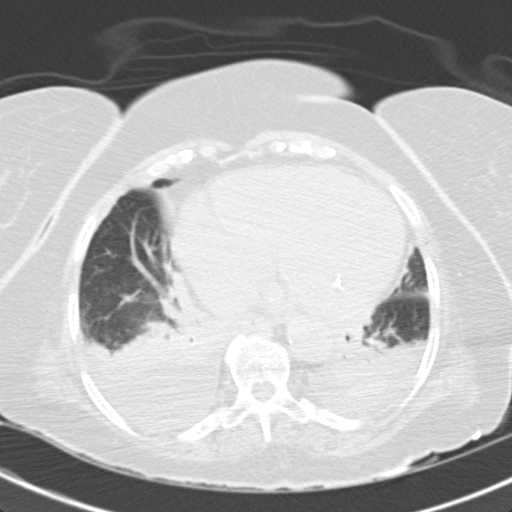
[im 43/95  mediastinal]
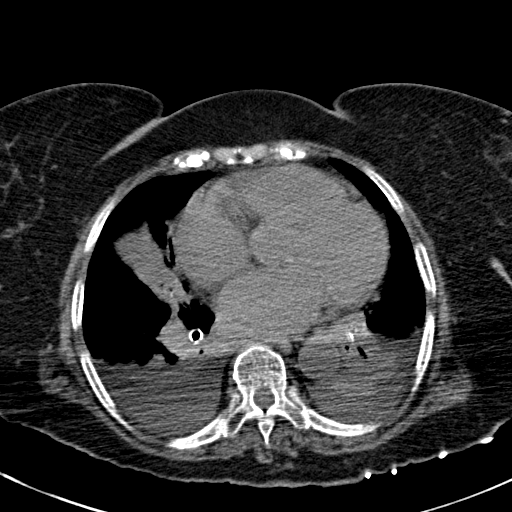
[im 43/95  lung]
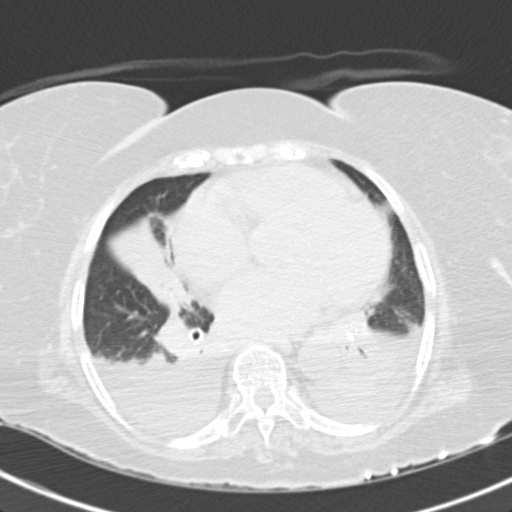
[im 52/95  lung]
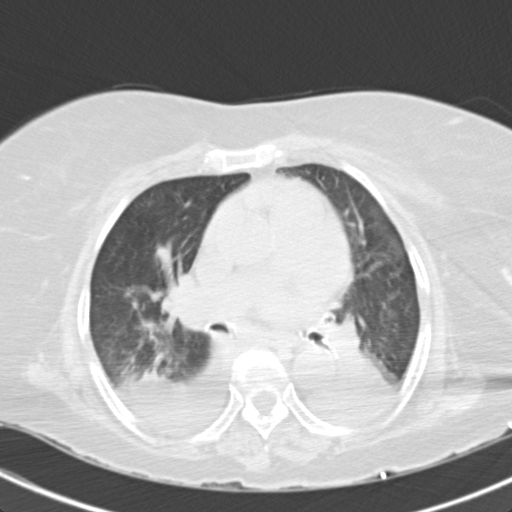
[im 60/95  lung]
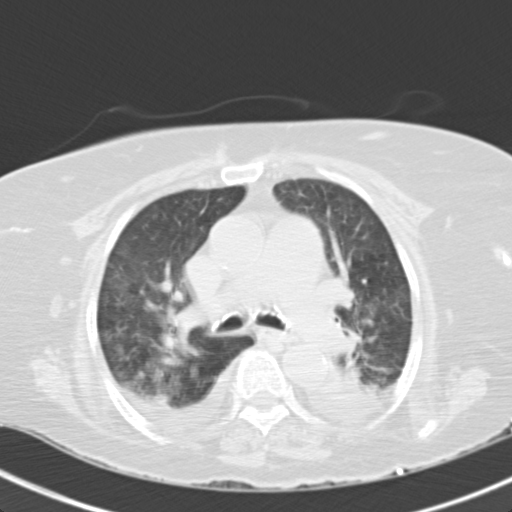
[im 69/95  lung]
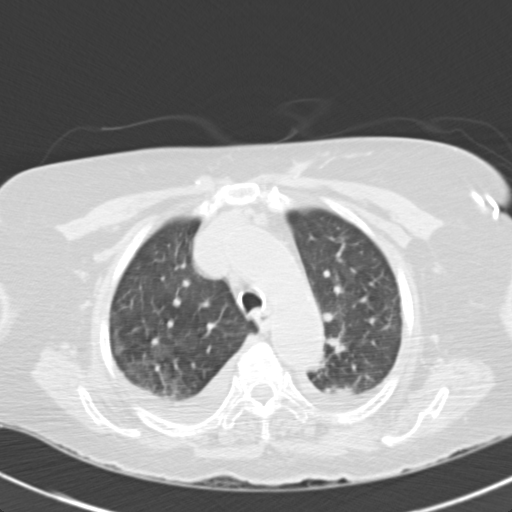
[im 77/95  mediastinal]
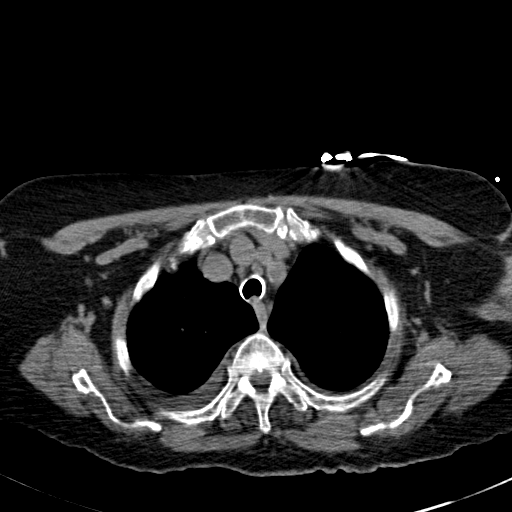
[im 77/95  lung]
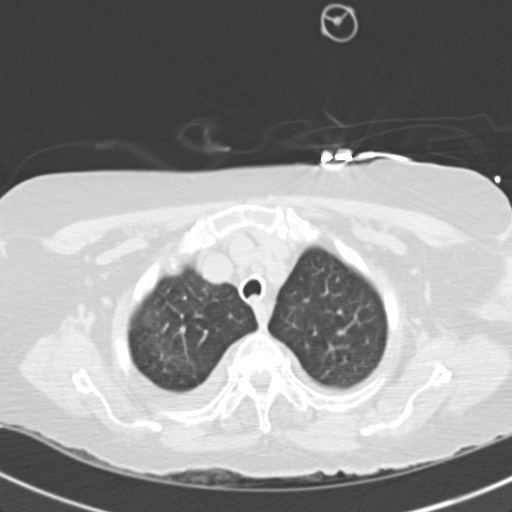
[im 86/95  lung]
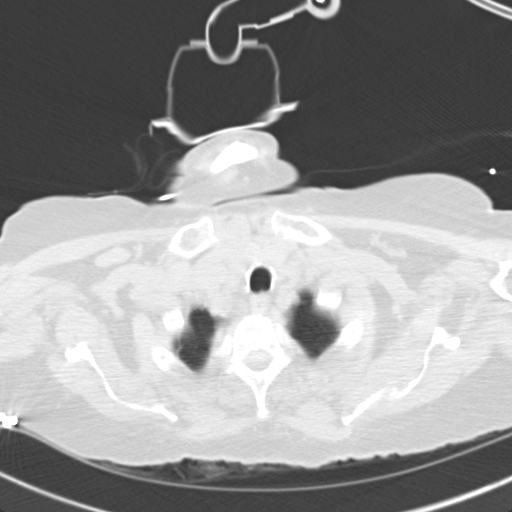

[Series 602: <mpr thick range> · coronal · 0.61mm/px · 3 of 137 slices shown]
[im 28/137  lung]
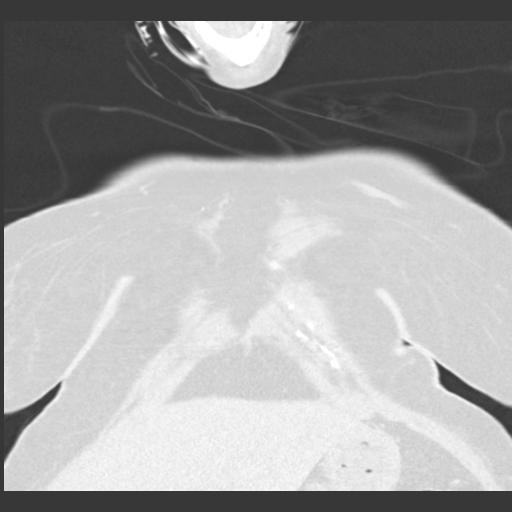
[im 55/137  lung]
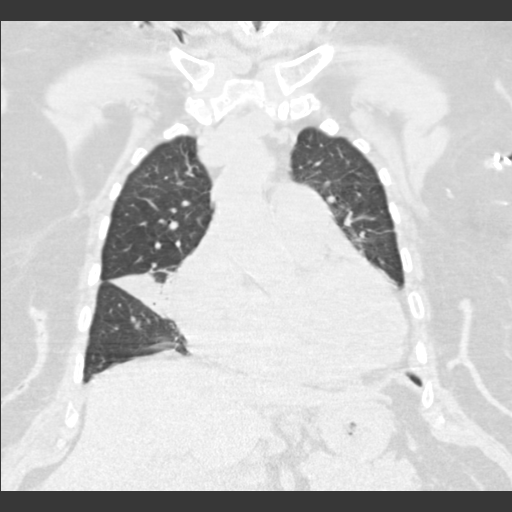
[im 82/137  lung]
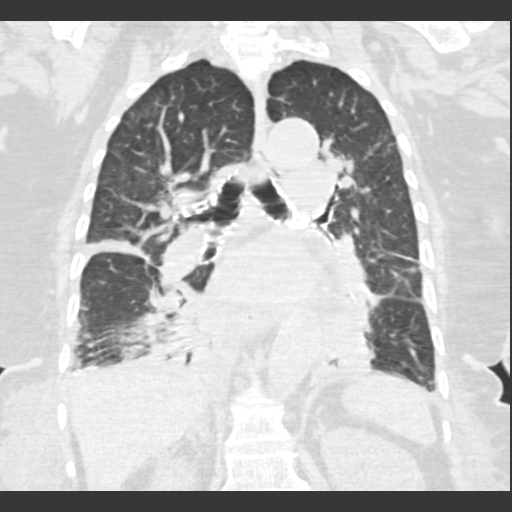

[13 of 36 positions shown; findings below may reference images not displayed]

FINDINGS: THORACIC INLET/BODY WALL:

No acute abnormality.

MEDIASTINUM:

Chronic cardiomegaly. No pericardial effusion. Atherosclerosis as
expected for age. No adenopathy detected.

LUNG WINDOWS:

Small borderline moderate pleural effusion with multi segment lower
and right middle lobe atelectasis. Mild patchy density in the apical
lungs could also be atelectasis. No convincing pneumonia. Occasional
septal thickening without definitive edema. No visible metastatic
disease.

UPPER ABDOMEN:

No acute findings. Stable appearance of subcapsular low-density
abnormality in the central liver with possible central
mineralization.

OSSEOUS:

Chronic appearing thoracic endplate concavities.  No acute finding.
IMPRESSION: Small to moderate pleural effusions with multi segment atelectasis.
